# Patient Record
Sex: Male | Born: 1970 | Race: White | Hispanic: No | Smoking: Former smoker
Health system: Southern US, Community
[De-identification: ages and names within clinical notes are randomized; demographics above are authoritative.]

## PROBLEM LIST (undated history)

## (undated) ENCOUNTER — Ambulatory Visit (HOSPITAL_COMMUNITY): Discharge status: Absent without leave | Payer: No Typology Code available for payment source

## (undated) DIAGNOSIS — F419 Anxiety disorder, unspecified: Secondary | ICD-10-CM

## (undated) DIAGNOSIS — F329 Major depressive disorder, single episode, unspecified: Secondary | ICD-10-CM

## (undated) DIAGNOSIS — F909 Attention-deficit hyperactivity disorder, unspecified type: Secondary | ICD-10-CM

## (undated) DIAGNOSIS — F32A Depression, unspecified: Secondary | ICD-10-CM

## (undated) DIAGNOSIS — F952 Tourette's disorder: Secondary | ICD-10-CM

## (undated) HISTORY — DX: Anxiety disorder, unspecified: F41.9

## (undated) HISTORY — DX: Depression, unspecified: F32.A

## (undated) HISTORY — DX: Attention-deficit hyperactivity disorder, unspecified type: F90.9

## (undated) HISTORY — DX: Tourette's disorder: F95.2

## (undated) HISTORY — DX: Major depressive disorder, single episode, unspecified: F32.9

---

## 2017-03-27 ENCOUNTER — Encounter: Payer: Self-pay | Admitting: Pulmonary Disease

## 2017-03-27 ENCOUNTER — Ambulatory Visit (INDEPENDENT_AMBULATORY_CARE_PROVIDER_SITE_OTHER)
Admission: RE | Admit: 2017-03-27 | Discharge: 2017-03-27 | Disposition: A | Discharge status: Home / Self Care | Payer: Non-veteran care | Source: Ambulatory Visit | Attending: Pulmonary Disease | Admitting: Pulmonary Disease

## 2017-03-27 ENCOUNTER — Ambulatory Visit (INDEPENDENT_AMBULATORY_CARE_PROVIDER_SITE_OTHER): Payer: Non-veteran care | Admitting: Pulmonary Disease

## 2017-03-27 VITALS — BP 106/64 | HR 100 | Ht 69.0 in | Wt 162.0 lb

## 2017-03-27 DIAGNOSIS — J440 Chronic obstructive pulmonary disease with acute lower respiratory infection: Secondary | ICD-10-CM

## 2017-03-27 DIAGNOSIS — R0602 Shortness of breath: Secondary | ICD-10-CM

## 2017-03-27 DIAGNOSIS — G4710 Hypersomnia, unspecified: Secondary | ICD-10-CM | POA: Insufficient documentation

## 2017-03-27 DIAGNOSIS — Z720 Tobacco use: Secondary | ICD-10-CM | POA: Diagnosis not present

## 2017-03-27 DIAGNOSIS — J449 Chronic obstructive pulmonary disease, unspecified: Secondary | ICD-10-CM | POA: Insufficient documentation

## 2017-03-27 MED ORDER — BUDESONIDE-FORMOTEROL FUMARATE 160-4.5 MCG/ACT IN AERO: 2.0000 | INHALATION_SPRAY | Freq: Two times a day (BID) | RESPIRATORY_TRACT | 0 refills | Status: DC

## 2017-03-27 NOTE — Addendum Note (Signed)
Addended by: Maurene CapesPOTTS, Cally Nygard M on: 03/27/2017 09:44 AM   Modules accepted: Orders

## 2017-03-27 NOTE — Assessment & Plan Note (Addendum)
Although technically this is not COPD, will treat him as same  Add Symbicort 160 twice daily -I suspect that he will not need this long-term if he is able to quit smoking.  Will reassess in 1 month if he needs this longer. Continue albuterol to use as needed for breakthrough symptoms  Will [facility] and if necessary will clarify with high-resolution CT chest without he does have interstitial lung disease are not

## 2017-03-27 NOTE — Assessment & Plan Note (Signed)
Smoking cessation was emphasized and he was counseled. He will meet with his mental health provider to discuss Chantix.  Mean while I discussed using nicotine inhaler or alternative and during smoking while driving and making another quit attempt.  His wife also makes smokes and this makes it more difficult

## 2017-03-27 NOTE — Progress Notes (Addendum)
Subjective:    Patient ID: Larry Stewart, male    DOB: 15-Oct-1970, 47 y.o.   MRN: [id number]  HPI  Chief Complaint  Patient presents with  . Pulm Consult    [facility], dry [facility] and difficulty swallowing. Started 4-5 years ago, now getting worse.     47 [facility] on exertion with wheezing and cough that has been ongoing for 4 years but worse within the last 2 months. [facility] with the pulmonologist was not until April hence he was referred to us. He reports dyspnea on exertion for at least 4 years.  He reports back-to-back episodes of bronchitis and URI in November and December 2018 requiring steroids and antibiotics and following this her symptoms have been much worse.  He can walk for short distances but anything more makes him struggle for breath.  He can carry his garbage can for about 20 yards and he is out of breath.  He also reports wheezing on exertion, triggered by heat or cold.  He has dry cough very occasionally productive of clear sputum that is not relieved by Robitussin.  He reports smothering spells when he has a blanket over his face. Albuterol seems to help with his symptoms, he was given [facility] couple of years ago and he is using this at least 4-5 times daily  He denies frequent chest colds.  He has tried to quit smoking in the past with the use of aids such as lozenges, nicotine patch, Wellbutrin.  He is awaiting appointment with his mental health provider to discuss Chantix  I reviewed [facility] from his P[facility] and mention is made of a CT thorax showing possible interstitial lung disease and chronic bronchitis  Past medical history-history of hypertension, severe depression and anxiety, ADHD and Tourette's syndrome requiring risperidone.  He also has hypothyroid and chronic back issues.  He has chronic insomnia On  trazodone and GERD He has a history of a AKi following UTI in 2015 He reports a history of asthma onset in his late teens that lasted 2-3 years and resolved when he started working in [employer]  He underwent vasectomy in 2016 and left ulnar nerve repair in 1986  Social history-he was in the [employer] from [employer]. Now works for [employer], lives with his wife and stepson. He started smoking around 89 years of age and smokes about a pack per day, more than 30 pack years  Family history-emphysema in his dad, heart disease in mother, throat cancer in sister.   Spirometry surprisingly shows ratio of 82, FEV1 of 88% and FVC of 75% suggestive of mild restriction    Review of Systems  Positive for shortness of breath with activity, nonproductive cough, acid heartburn indigestion, difficulty swallowing, headaches and nasal congestion, earache, anxiety depression  Constitutional: negative for anorexia, fevers and sweats  Eyes: negative for irritation, redness and visual disturbance  Ears, nose, mouth, throat, and face: negative for earaches, epistaxis, nasal congestion and sore throat  Respiratory: negative for sputum and wheezing  Cardiovascular: negative for chest pain, dyspnea, lower extremity edema, orthopnea, palpitations and syncope  Gastrointestinal: negative for abdominal pain, constipation, diarrhea, melena, nausea and vomiting  Genitourinary:negative for dysuria, frequency and hematuria  Hematologic/lymphatic: negative for bleeding, easy bruising and lymphadenopathy  Musculoskeletal:negative for arthralgias, muscle weakness and stiff joints  Neurological: negative  for coordination problems, gait problems, headaches and weakness  Endocrine: negative for diabetic symptoms including polydipsia, polyuria and weight loss      Objective:   Physical Exam  Gen. Pleasant, obese, in no distress, anxious affect ENT - no lesions, no post nasal drip, class 2 airway Neck: No JVD, no thyromegaly,  no carotid bruits Lungs: no use of accessory muscles, no dullness to percussion, decreased without rales or rhonchi  Cardiovascular: Rhythm regular, heart sounds  normal, no murmurs or gallops, no peripheral edema Abdomen: soft and non-tender, no hepatosplenomegaly, BS normal. Musculoskeletal: No deformities, no cyanosis or clubbing Neuro:  alert, non focal, no tremors       Assessment & Plan:

## 2017-03-27 NOTE — Patient Instructions (Addendum)
Chest x-ray today Trial of Symbicort 160- 2 puffs twice daily-rinse mouth after use  You have to make another attempt to quit smoking

## 2017-03-27 NOTE — Assessment & Plan Note (Signed)
-  Related to medications such as clonidine and risperidone. I have asked him to stop taking daytime dose of clonidine

## 2017-04-04 ENCOUNTER — Telehealth: Payer: Self-pay | Admitting: Pulmonary Disease

## 2017-04-04 NOTE — Telephone Encounter (Signed)
Sorry that was [place] from the [facility]

## 2017-04-04 NOTE — Telephone Encounter (Signed)
Called [place] back. She did not answer. Apparently she does not have a VM either. Will call back.   If she calls back, please get a fax number so that we can fax the OV notes to her. Thanks!

## 2017-04-08 NOTE — Telephone Encounter (Signed)
Attempted to contact Synetta FailAnita. No answer, no option to leave a message. Will try back.

## 2017-04-10 NOTE — Telephone Encounter (Signed)
.  I called Synetta Failnita but no answer and unable to leave message.

## 2017-04-11 NOTE — Telephone Encounter (Signed)
Attempted to contact Larry FailAnita x3. No answer, no option to leave a message. We have attempted to contact her several times with no success or call back. Per triage protocol, message will be closed.

## 2017-04-30 ENCOUNTER — Encounter: Payer: Self-pay | Admitting: Adult Health

## 2017-04-30 ENCOUNTER — Ambulatory Visit (INDEPENDENT_AMBULATORY_CARE_PROVIDER_SITE_OTHER): Payer: No Typology Code available for payment source | Admitting: Adult Health

## 2017-04-30 DIAGNOSIS — Z720 Tobacco use: Secondary | ICD-10-CM | POA: Diagnosis not present

## 2017-04-30 DIAGNOSIS — K219 Gastro-esophageal reflux disease without esophagitis: Secondary | ICD-10-CM | POA: Insufficient documentation

## 2017-04-30 DIAGNOSIS — R0602 Shortness of breath: Secondary | ICD-10-CM | POA: Diagnosis not present

## 2017-04-30 DIAGNOSIS — J449 Chronic obstructive pulmonary disease, unspecified: Secondary | ICD-10-CM

## 2017-04-30 MED ORDER — BUDESONIDE-FORMOTEROL FUMARATE 160-4.5 MCG/ACT IN AERO: 2.0000 | INHALATION_SPRAY | Freq: Two times a day (BID) | RESPIRATORY_TRACT | 0 refills | Status: DC

## 2017-04-30 MED ORDER — BUDESONIDE-FORMOTEROL FUMARATE 160-4.5 MCG/ACT IN AERO: 2.0000 | INHALATION_SPRAY | Freq: Two times a day (BID) | RESPIRATORY_TRACT | 11 refills | Status: DC

## 2017-04-30 NOTE — Assessment & Plan Note (Signed)
Continue on GERD treatment and diet

## 2017-04-30 NOTE — Progress Notes (Signed)
47 y/o M presents today for refill on Simbicort.  Last seen by Dr. Vassie LollAlva on 03/27/17 for chronic SOB and chronic cough.  States since then he has been taking the Simbicort as prescribed and his SOB with exertion has been a lot better.  States he is able to walk longer distance now before becoming SOB.  He continues to cough up white frothy sputum and sometimes clear sputum.  Cough is worse first thing in the morning and gets better as the day progresses.  Pt continues to smoke cigarettes, 1 pack per day.  Waiting on mental health provider to sign off on Chantix.  States he has had 10lbs unintentional weight loss in the last 2 months.  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  Endorses SOB with exertion, fatigue, sleepy all the time, wheezing with exertion, and occasional vomiting after meals.  Denies hemoptysis, chest pain or palpitations.  Pt is on protonix and zantac to control his GERD symptoms.  Pt is on multiple sedating medications.  Pt is requesting a refill on his Simbicort.  PE:  Ged: Pleasant, in no distress, anxious affect ENT: no lesions, no post nasal drip Neck: No JVD Cardiovascular: Rhythm regular, tachycardia (HR=103), no peripheral edema Lungs: no use of accessory muscles, few scattered rhonchi in bilateral lungs bases Abd: Soft and non-tender, normal BS Neuro: alert, no tremors

## 2017-04-30 NOTE — Assessment & Plan Note (Signed)
Smoking cessation  

## 2017-04-30 NOTE — Progress Notes (Signed)
 @Patient  ID: Larry Stewart, male    DOB: [birth year], 47 y.o.   MRN: [id number]  Chief Complaint  Patient presents with  . Follow-up    [facility]  HPI: 47 yo male smoker , veteran, seen for pulmonary consult 03/2017 for dyspnea.   PMH significant for depression, anxiety , ADHD, and Tourettes -on multiple meds   TEST  Spirometry 03/2017  shows ratio of 82, FEV1 of 88% and FVC of 75% suggestive of mild restriction CXR 03/2017 Clear lungs    04/30/2017 Follow up : COPD  Patient returns for a 2-week follow-up.  Patient was seen last visit for a pulmonary consult for ongoing dyspnea and cough.  Patient is a heavy long-term smoker.  [facility]  Patient says he has had ongoing cough and shortness of breath.  Spirometry showed mild restriction with no significant obstruction.  Patient was started on Symbicort twice daily.  Patient says it did make a difference his shortness of breath decreased substantially.  He still has some ongoing cough.  But this is improved slightly.  Patient is a [employer].  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 Patient does complain he is lost 10 pounds over the last 2 month unintentionally.he denies any hemoptysis chest pain orthopnea PND or increased leg swelling.  He is having significant reflux despite using PPI twice daily and Zantac at bedtime.  He is being followed at GI.  Chest x-ray last visit showed clear lungs. Smoking cessation was discussed with patient.  Patient has tried many things without success..  Patient says he is going to see his mental health provider.  And is going to acid discuss possible Chantix.  Patient is on multiple medications for depression anxiety, and Tourette's.  He is on BuSpar Prozac clonidine Risperdal and trazodone.  Says he feels tired most of the time.  Allergies  Allergen Reactions  . Amlodipine   . Bee Venom     Immunization History    Administered Date(s) Administered  . Influenza Whole 02/19/2017    Past Medical History:  Diagnosis Date  . ADHD   . Anxiety   . Depression   . Tourette's     Tobacco History: Social History   Tobacco Use  Smoking Status Current Every Day Smoker  . Packs/day: 1.00  . Types: Cigarettes  Smokeless Tobacco Never Used   Ready to quit: No Counseling given: Yes   Outpatient Encounter Medications as of 04/30/2017  Medication Sig  . ACETAMINOPHEN-BUTALBITAL 50-325 MG TABS Take 1 tablet by mouth 2 (two) times daily as needed.  Marland Kitchen. albuterol (PROVENTIL HFA;VENTOLIN HFA) 108 (90 Base) MCG/ACT inhaler Inhale 2 puffs into the lungs every 4 (four) hours as needed. Inhale 2 puffs by mouth every four hours as needed for shortness of breath  . budesonide-formoterol (SYMBICORT) 160-4.5 MCG/ACT inhaler Inhale 2 puffs into the lungs 2 (two) times daily.  . budesonide-formoterol (SYMBICORT) 160-4.5 MCG/ACT inhaler Inhale 2 puffs into the lungs 2 (two) times daily.  . budesonide-formoterol (SYMBICORT) 160-4.5 MCG/ACT inhaler Inhale 2 puffs into the lungs 2 (two) times daily.  . busPIRone (BUSPAR) 10 MG tablet Take 10 mg by mouth 3 (three) times daily.  . cetirizine (ZYRTEC) 10 MG tablet Take 10 mg by mouth daily. Take one tablet by mouth daily as needed for allergies  . cloNIDine (CATAPRES) 0.2 MG tablet Take 0.2 mg by mouth 2 (two) times daily.  .Marland Kitchen  FLUoxetine (PROZAC) 20 MG tablet Take 20 mg by mouth daily.  . fluticasone (FLONASE) 50 MCG/ACT nasal spray Place 1 spray into both nostrils 2 (two) times daily as needed.  Marland Kitchen levothyroxine (SYNTHROID, LEVOTHROID) 50 MCG tablet Take 50 mcg by mouth daily before breakfast.  . pantoprazole (PROTONIX) 40 MG tablet Take 40 mg by mouth 2 (two) times daily.  . polyvinyl alcohol (LIQUIFILM TEARS) 1.4 % ophthalmic solution Place 1 drop into both eyes 4 (four) times daily. Instill 1 drop in both eyes four times a day  . ranitidine (ZANTAC) 150 MG capsule Take by  mouth.  . risperidone (RISPERDAL) 4 MG tablet Take 4 mg by mouth 2 (two) times daily.  Marland Kitchen senna-docusate (SENOKOT-S) 8.6-50 MG tablet Take 1 tablet by mouth daily.  . tamsulosin (FLOMAX) 0.4 MG CAPS capsule Take 0.4 mg by mouth.  . traZODone (DESYREL) 100 MG tablet Take 100 mg by mouth at bedtime.   No facility-administered encounter medications on file as of 04/30/2017.      Review of Systems  Constitutional:   No  weight loss, night sweats,  Fevers, chills,  +fatigue, or  lassitude.  HEENT:   No headaches,  Difficulty swallowing,  Tooth/dental problems, or  Sore throat,                No sneezing, itching, ear ache, nasal congestion, post nasal drip,   CV:  No chest pain,  Orthopnea, PND, swelling in lower extremities, anasarca, dizziness, palpitations, syncope.   GI  + GERD , nausea, vomiting, diarrhea, change in bowel habits, loss of appetite, bloody stools.   Resp:   No chest wall deformity  Skin: no rash or lesions.  GU: no dysuria, change in color of urine, no urgency or frequency.  No flank pain, no hematuria   MS:  No joint pain or swelling.  No decreased range of motion.  No back pain.    Physical Exam  BP 104/68 (BP Location: Left Arm, Cuff Size: Normal)   Pulse (!) 103   Ht 5\' 9"  (1.753 m)   Wt 159 lb 6.4 oz (72.3 kg)   SpO2 99%   BMI 23.54 kg/m   GEN: A/Ox3; pleasant , NAD, thin male    HEENT:  [place]/AT,  EACs-clear, TMs-wnl, NOSE-clear, THROAT-clear, no lesions, no postnasal drip or exudate noted.   NECK:  Supple w/ fair ROM; no JVD; normal carotid impulses w/o bruits; no thyromegaly or nodules palpated; no lymphadenopathy.    RESP  Faint crackles noted , no accessory muscle use, no dullness to percussion  CARD:  RRR, no m/r/g, no peripheral edema, pulses intact, no cyanosis or clubbing.  GI:   Soft & nt; nml bowel sounds; no organomegaly or masses detected.   Musco: Warm bil, no deformities or joint swelling noted.   Neuro: alert, no focal deficits  noted.    Skin: Warm, no lesions or rashes    Lab Results:  CBC No results found for: WBC, RBC, HGB, HCT, PLT, MCV, MCH, MCHC, RDW, LYMPHSABS, MONOABS, EOSABS, BASOSABS  BMET No results found for: NA, K, CL, CO2, GLUCOSE, BUN, CREATININE, CALCIUM, GFRNONAA, GFRAA  BNP No results found for: BNP  ProBNP No results found for: PROBNP  Imaging: No results found.   Assessment & Plan:   COPD (chronic obstructive pulmonary disease) (HCC) Mild airway restriction noted.  No significant obstruction noted.  Smoking cessation was discussed.  Patient did have symptomatic improvement on Symbicort.  Will restart this.  Have patient  return with a full PFT. 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 We will check a high resolution CT chest to evaluate further.  Plan  Patient Instructions  Set up for HRCT Chest .  Restart Symbicort 2 puffs Twice daily  , rinse after use.  Work on not smokiing.  Mucinex DM Twice daily As needed  Cough/congestion .  GERD diet and treatment .  Follow up with Dr. Vassie Loll  In 6 weeks with PFT and As needed   Please contact office for sooner follow up if symptoms do not improve or worsen or seek emergency care       Tobacco abuse Smoking cessation  GERD (gastroesophageal reflux disease) Continue on GERD treatment and diet     Rubye Oaks, NP 04/30/2017

## 2017-04-30 NOTE — Patient Instructions (Addendum)
Set up for HRCT Chest .  Restart Symbicort 2 puffs Twice daily  , rinse after use.  Work on not smokiing.  Mucinex DM Twice daily As needed  Cough/congestion .  GERD diet and treatment .  Follow up with Dr. Vassie LollAlva  In 6 weeks with PFT and As needed   Please contact office for sooner follow up if symptoms do not improve or worsen or seek emergency care

## 2017-04-30 NOTE — Assessment & Plan Note (Signed)
Mild airway restriction noted.  No significant obstruction noted.  Smoking cessation was discussed.  Patient did have symptomatic improvement on Symbicort.  Will restart this.  Have patient return with a full PFT. 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 We will check a high resolution CT chest to evaluate further.  Plan  Patient Instructions  Set up for HRCT Chest .  Restart Symbicort 2 puffs Twice daily  , rinse after use.  Work on not smokiing.  Mucinex DM Twice daily As needed  Cough/congestion .  GERD diet and treatment .  Follow up with Dr. Vassie LollAlva  In 6 weeks with PFT and As needed   Please contact office for sooner follow up if symptoms do not improve or worsen or seek emergency care

## 2017-05-08 NOTE — Progress Notes (Signed)
Reviewed & agree with plan  

## 2017-05-15 ENCOUNTER — Ambulatory Visit (INDEPENDENT_AMBULATORY_CARE_PROVIDER_SITE_OTHER)
Admission: RE | Admit: 2017-05-15 | Discharge: 2017-05-15 | Disposition: A | Discharge status: Home / Self Care | Payer: Non-veteran care | Source: Ambulatory Visit | Attending: Adult Health | Admitting: Adult Health

## 2017-05-15 DIAGNOSIS — R0602 Shortness of breath: Secondary | ICD-10-CM | POA: Diagnosis not present

## 2017-05-20 ENCOUNTER — Telehealth: Payer: Self-pay | Admitting: Pulmonary Disease

## 2017-05-20 NOTE — Telephone Encounter (Signed)
Called and spoke with patient he is aware of results and verbalized understanding. 

## 2017-05-24 ENCOUNTER — Telehealth: Payer: Self-pay | Admitting: Pulmonary Disease

## 2017-05-24 DIAGNOSIS — J439 Emphysema, unspecified: Secondary | ICD-10-CM

## 2017-05-24 NOTE — Telephone Encounter (Signed)
I have obtained and reviewed [facility] Issues bilateral upper lobe subpleural groundglass opacities and tree-in-bud nodules with paraseptal emphysema-this may relate to smoking-related interstitial lung disease. There is a small 4 mm nodule in the left upper lobe.  Suggest follow-up high-resolution CT chest without contrast in 02/2018

## 2017-05-27 NOTE — Telephone Encounter (Signed)
Called pt letting him know the results of ct scan that was from 02/25/17. Stated to pt we would do a follow up ct scan 02/2017.  Pt expressed understanding. Order placed to have HRCT 12/19  Nothing further needed at this current time.

## 2017-05-27 NOTE — Telephone Encounter (Signed)
Pt is returning call. Cb is [phone]

## 2017-05-27 NOTE — Telephone Encounter (Signed)
Left message for patient to call back  

## 2017-06-07 ENCOUNTER — Ambulatory Visit (INDEPENDENT_AMBULATORY_CARE_PROVIDER_SITE_OTHER): Payer: No Typology Code available for payment source | Admitting: Internal Medicine

## 2017-06-07 ENCOUNTER — Ambulatory Visit: Payer: No Typology Code available for payment source | Admitting: Pulmonary Disease

## 2017-06-07 DIAGNOSIS — J449 Chronic obstructive pulmonary disease, unspecified: Secondary | ICD-10-CM | POA: Diagnosis not present

## 2017-06-07 DIAGNOSIS — R0602 Shortness of breath: Secondary | ICD-10-CM

## 2017-06-07 LAB — PULMONARY FUNCTION TEST
DL/VA % PRED: 72 %
DL/VA: 3.34 ml/min/mmHg/L
DLCO UNC: 19.53 ml/min/mmHg
DLCO unc % pred: 63 %
FEF 25-75 POST: 2.96 L/s
FEF 25-75 Pre: 3.35 L/sec
FEF2575-%Change-Post: -11 %
FEF2575-%PRED-POST: 82 %
FEF2575-%Pred-Pre: 93 %
FEV1-%Change-Post: -3 %
FEV1-%Pred-Post: 78 %
FEV1-%Pred-Pre: 81 %
FEV1-Post: 3.09 L
FEV1-Pre: 3.21 L
FEV1FVC-%Change-Post: -4 %
FEV1FVC-%PRED-PRE: 104 %
FEV6-%Change-Post: 0 %
FEV6-%Pred-Post: 80 %
FEV6-%Pred-Pre: 79 %
FEV6-Post: 3.9 L
FEV6-Pre: 3.86 L
FEV6FVC-%CHANGE-POST: 0 %
FEV6FVC-%Pred-Post: 103 %
FEV6FVC-%Pred-Pre: 102 %
FVC-%Change-Post: 0 %
FVC-%PRED-POST: 78 %
FVC-%PRED-PRE: 77 %
FVC-POST: 3.9 L
FVC-Pre: 3.87 L
POST FEV1/FVC RATIO: 79 %
Post FEV6/FVC ratio: 100 %
Pre FEV1/FVC ratio: 83 %
Pre FEV6/FVC Ratio: 100 %
RV % pred: 87 %
RV: 1.65 L
TLC % pred: 86 %
TLC: 5.83 L

## 2017-06-07 NOTE — Progress Notes (Signed)
PFT done today. 

## 2017-06-11 ENCOUNTER — Ambulatory Visit (INDEPENDENT_AMBULATORY_CARE_PROVIDER_SITE_OTHER): Payer: Non-veteran care | Admitting: Pulmonary Disease

## 2017-06-11 ENCOUNTER — Encounter: Payer: Self-pay | Admitting: Pulmonary Disease

## 2017-06-11 DIAGNOSIS — J432 Centrilobular emphysema: Secondary | ICD-10-CM

## 2017-06-11 DIAGNOSIS — J849 Interstitial pulmonary disease, unspecified: Secondary | ICD-10-CM | POA: Diagnosis not present

## 2017-06-11 DIAGNOSIS — Z720 Tobacco use: Secondary | ICD-10-CM

## 2017-06-11 NOTE — Progress Notes (Signed)
   Subjective:    Patient ID: Larry Stewart, male    DOB: July 17, 1970, 47 y.o.   MRN: [id number]  HPI  47 yo male smoker , veteran, for follow-up of dyspnea I have reviewed his prior CT scan from 12/28 which shows faint groundglass markings in both upper lobes and paraseptal emphysema. Repeat CT scan 04/2017 was reviewed in detail which showed no progression of these findings.  PFTs were also reviewed today  .  He continues on Symbicort and feels like this is helping   Past medical history-history of hypertension, severe depression and anxiety, ADHD and Tourette's syndrome requiring risperidone.  He also has hypothyroid and chronic back issues.  He has chronic insomnia  Significant tests/ events reviewed  Spirometry 03/2017  shows ratio of 82, FEV1 of 88% and FVC of 75% suggestive of mild restriction  PFTs 05/2017 ratio normal, FEV1 81% DLCO 63%, TLC normal denies cough, he had an ED visit for flulike symptoms and was given albuterol nebs and nasal inhaler  CT chest 02/2017  bilateral upper lobe subpleural groundglass opacities and tree-in-bud nodules with paraseptal emphysema-this may relate to smoking-related interstitial lung disease.4 mm nodule in the left upper lobe.  Review of Systems Patient denies significant dyspnea,cough, hemoptysis,  chest pain, palpitations, pedal edema, orthopnea, paroxysmal nocturnal dyspnea, lightheadedness, nausea, vomiting, abdominal or  leg pains      Objective:   Physical Exam  Gen. Pleasant, well-nourished, in no distress ENT - no thrush, no post nasal drip Neck: No JVD, no thyromegaly, no carotid bruits Lungs: no use of accessory muscles, no dullness to percussion, clear without rales or rhonchi  Cardiovascular: Rhythm regular, heart sounds  normal, no murmurs or gallops, no peripheral edema Musculoskeletal: No deformities, no cyanosis or clubbing        Assessment & Plan:

## 2017-06-11 NOTE — Assessment & Plan Note (Signed)
Smoking cessation is the most important intervention. He has met with a psychiatrist and discuss Chantix, preferred to try nicotine supplements before

## 2017-06-11 NOTE — Assessment & Plan Note (Signed)
Continue on Symbicort for now and if you are able to quit smoking, you may be able to come off this medication

## 2017-06-11 NOTE — Patient Instructions (Signed)
You have emphysema and the beginnings of smoking-related damage to your lungs. Smoking cessation is the most important intervention. Continue on Symbicort for now and if you are able to quit smoking, you may be able to come off this medication

## 2017-06-11 NOTE — Assessment & Plan Note (Addendum)
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

## 2017-07-16 ENCOUNTER — Other Ambulatory Visit: Payer: Self-pay

## 2017-07-16 ENCOUNTER — Ambulatory Visit
Admission: RE | Admit: 2017-07-16 | Discharge: 2017-07-16 | Disposition: A | Discharge status: Home / Self Care | Payer: Self-pay | Source: Ambulatory Visit | Attending: Pulmonary Disease | Admitting: Pulmonary Disease

## 2017-07-16 DIAGNOSIS — J432 Centrilobular emphysema: Secondary | ICD-10-CM

## 2017-12-18 ENCOUNTER — Ambulatory Visit: Payer: Non-veteran care | Admitting: Pulmonary Disease

## 2018-02-14 ENCOUNTER — Ambulatory Visit (INDEPENDENT_AMBULATORY_CARE_PROVIDER_SITE_OTHER)
Admission: RE | Admit: 2018-02-14 | Discharge: 2018-02-14 | Disposition: A | Discharge status: Home / Self Care | Payer: No Typology Code available for payment source | Source: Ambulatory Visit | Attending: Pulmonary Disease | Admitting: Pulmonary Disease

## 2018-02-14 DIAGNOSIS — J439 Emphysema, unspecified: Secondary | ICD-10-CM | POA: Diagnosis not present

## 2018-02-17 ENCOUNTER — Ambulatory Visit: Payer: Non-veteran care | Admitting: Pulmonary Disease

## 2018-02-18 ENCOUNTER — Telehealth: Payer: Self-pay | Admitting: Pulmonary Disease

## 2018-02-18 ENCOUNTER — Ambulatory Visit: Payer: Non-veteran care | Admitting: Pulmonary Disease

## 2018-02-18 NOTE — Telephone Encounter (Signed)
OV [facility] 06/11/17, [facility], to [phone]. Left detailed message for Janelle Flooraomi, informing her of requested fax being sent.  Nothing further at this time.

## 2018-03-04 ENCOUNTER — Encounter: Payer: Self-pay | Admitting: Primary Care

## 2018-03-04 ENCOUNTER — Ambulatory Visit: Payer: Non-veteran care | Admitting: Nurse Practitioner

## 2018-03-04 ENCOUNTER — Ambulatory Visit (INDEPENDENT_AMBULATORY_CARE_PROVIDER_SITE_OTHER): Payer: No Typology Code available for payment source | Admitting: Primary Care

## 2018-03-04 VITALS — BP 136/86 | HR 92 | Temp 97.9°F | Ht 69.0 in | Wt 157.2 lb

## 2018-03-04 DIAGNOSIS — J849 Interstitial pulmonary disease, unspecified: Secondary | ICD-10-CM | POA: Diagnosis not present

## 2018-03-04 MED ORDER — ALBUTEROL SULFATE HFA 108 (90 BASE) MCG/ACT IN AERS: 2.0000 | INHALATION_SPRAY | RESPIRATORY_TRACT | 3 refills | Status: DC | PRN

## 2018-03-04 MED ORDER — PREDNISONE 10 MG PO TABS: ORAL_TABLET | ORAL | 0 refills | Status: DC

## 2018-03-04 NOTE — Assessment & Plan Note (Signed)
-  Advised Symbicort 160- two puffs TWICE daily

## 2018-03-04 NOTE — Patient Instructions (Addendum)
STOP vaping, smoking and marijuana!  Labs- ANA and CCP   Prednisone 20mg  x 1 week; 10mg  x 1 week; 5mg  x 1 week  Make sure to use Symbicort twice daily as prescribed  Follow up with Dr. Vassie LollAlva next available in 6 weeks with PFTs prior

## 2018-03-04 NOTE — Progress Notes (Signed)
@Patient  ID: Larry Stewart, male    DOB: [birth year], 47 y.o.   MRN: [id number]  Chief Complaint  Patient presents with  . Follow-up    SOB, chest tightness, wheezing, cough fit with vommiting, knot in throat (thing get caught on it)    Referring provider: [facility]  HPI: 47 year old male, current smoker. PMH significant for COPD, ILD and GERD. Patient of Dr. Vassie Loll, last seen on 06/11/17.   03/04/2018  Patient presents today for a routine follow-up to review recent imaging.  HRCT in November showed evidence of ILD with an unusual pattern. Patient states that he is still smoking.  Admits to smoking marijuana since last year and also vaping THC oil. He last vaped approximately 2 weeks ago.  He would like to stop smoking, has an appointment with a hip unit test this week.  He has been unable to quit in the past with patches or gum. Reports coughing fits to the point of getting sick. Associated wheezing and sob. He has not been taking Symbicort twice daily. Hx hiatal hernia. States that he saw GI at [facility] clinic recently.     Imaging: Chest CT 02/14/18- There is evidence of interstitial lung disease, with an unusual pattern of findings, as detailed above. Based on these findings, this is categorized as alternative diagnosis per current ATS guidelines. Favored differential considerations is that of nonspecific interstitial pneumonia (NSIP) superimposed upon a background of emphysematous changes.   Diffuse bronchial wall thickening with mild centrilobular and mild-to-moderate paraseptal emphysema; imaging findings compatible with reported clinical history of COPD.  Allergies  Allergen Reactions  . Amlodipine   . Bee Venom     Immunization History  Administered Date(s) Administered  . Influenza Whole 02/19/2017    Past Medical History:  Diagnosis Date  . ADHD   . Anxiety   . Depression   . Tourette's     Tobacco History: Social History   Tobacco Use    Smoking Status Current Every Day Smoker  . Packs/day: 1.00  . Types: Cigarettes  Smokeless Tobacco Never Used   Ready to quit: Not Answered Counseling given: Not Answered   Outpatient Medications Prior to Visit  Medication Sig Dispense Refill  . ACETAMINOPHEN-BUTALBITAL 50-325 MG TABS Take 1 tablet by mouth 2 (two) times daily as needed.    . budesonide-formoterol (SYMBICORT) 160-4.5 MCG/ACT inhaler Inhale 2 puffs into the lungs 2 (two) times daily. 1 Inhaler 0  . budesonide-formoterol (SYMBICORT) 160-4.5 MCG/ACT inhaler Inhale 2 puffs into the lungs 2 (two) times daily. 1 Inhaler 11  . busPIRone (BUSPAR) 10 MG tablet Take 10 mg by mouth 3 (three) times daily.    . cetirizine (ZYRTEC) 10 MG tablet Take 10 mg by mouth daily. Take one tablet by mouth daily as needed for allergies    . cloNIDine (CATAPRES) 0.2 MG tablet Take 0.2 mg by mouth 2 (two) times daily.    Marland Kitchen FLUoxetine (PROZAC) 20 MG tablet Take 20 mg by mouth daily.    . fluticasone (FLONASE) 50 MCG/ACT nasal spray Place 1 spray into both nostrils 2 (two) times daily as needed.    Marland Kitchen levothyroxine (SYNTHROID, LEVOTHROID) 50 MCG tablet Take 50 mcg by mouth daily before breakfast.    . pantoprazole (PROTONIX) 40 MG tablet Take 40 mg by mouth 2 (two) times daily.    . polyvinyl alcohol (LIQUIFILM TEARS) 1.4 % ophthalmic solution Place 1 drop into both eyes 4 (four) times daily. Instill 1 drop in  both eyes four times a day    . ranitidine (ZANTAC) 150 MG capsule Take by mouth.    . risperidone (RISPERDAL) 4 MG tablet Take 4 mg by mouth 2 (two) times daily.    Marland Kitchen senna-docusate (SENOKOT-S) 8.6-50 MG tablet Take 1 tablet by mouth daily.    . tamsulosin (FLOMAX) 0.4 MG CAPS capsule Take 0.4 mg by mouth.    . traZODone (DESYREL) 100 MG tablet Take 100 mg by mouth at bedtime.    Marland Kitchen albuterol (PROVENTIL HFA;VENTOLIN HFA) 108 (90 Base) MCG/ACT inhaler Inhale 2 puffs into the lungs every 4 (four) hours as needed. Inhale 2 puffs by mouth every  four hours as needed for shortness of breath     No facility-administered medications prior to visit.    Review of Systems  Review of Systems  Constitutional: Negative.   Respiratory: Positive for cough, shortness of breath and wheezing.   Cardiovascular: Negative.   Gastrointestinal: Positive for vomiting.   Physical Exam  BP 136/86 (BP Location: Left Arm, Cuff Size: Normal)   Pulse 92   Temp 97.9 F (36.6 C)   Ht 5\' 9"  (1.753 m)   Wt 157 lb 3.2 oz (71.3 kg)   SpO2 96%   BMI 23.21 kg/m  Physical Exam  Constitutional: He is oriented to person, place, and time.  Slender adult male   HENT:  Head: Normocephalic and atraumatic.  Eyes: Pupils are equal, round, and reactive to light. EOM are normal.  Neck: Normal range of motion. Neck supple. No tracheal deviation present. No thyromegaly present.  Cardiovascular: Normal rate and regular rhythm.  Pulmonary/Chest: Effort normal. No respiratory distress. He has no wheezes.  Mostly clear t/o; no significant wheezing, rales or rhonchi  Musculoskeletal: Normal range of motion.  Lymphadenopathy:    He has no cervical adenopathy.  Neurological: He is alert and oriented to person, place, and time.  Skin: Skin is warm and dry.  Tattoos to arms and chest  Psychiatric: He has a normal mood and affect. His behavior is normal. Judgment and thought content normal.     Lab Results:  CBC No results found for: WBC, RBC, HGB, HCT, PLT, MCV, MCH, MCHC, RDW, LYMPHSABS, MONOABS, EOSABS, BASOSABS  BMET No results found for: NA, K, CL, CO2, GLUCOSE, BUN, CREATININE, CALCIUM, GFRNONAA, GFRAA  BNP No results found for: BNP  ProBNP No results found for: PROBNP  Imaging: Ct Chest High Resolution  Result Date: 02/14/2018 CLINICAL DATA:  47 year old male with history of COPD. Recent lung infection 1 month ago. Treated with antibiotics. Chronic productive cough. EXAM: CT CHEST WITHOUT CONTRAST TECHNIQUE: Multidetector CT imaging of the chest  was performed following the standard protocol without intravenous contrast. High resolution imaging of the lungs, as well as inspiratory and expiratory imaging, was performed. COMPARISON:  High-resolution chest CT 05/15/2017. FINDINGS: Cardiovascular: Heart size is normal. There is no significant pericardial fluid, thickening or pericardial calcification. Aortic atherosclerosis. No definite coronary artery calcifications. Mediastinum/Nodes: No pathologically enlarged mediastinal or hilar lymph nodes. Please note that accurate exclusion of hilar adenopathy is limited on noncontrast CT scans. Esophagus is unremarkable in appearance. No axillary lymphadenopathy. Lungs/Pleura: High-resolution images demonstrate relatively widespread but very mild ground-glass attenuation and peripheral predominant septal thickening. These findings have no definitive craniocaudal gradient, and in fact, the areas of septal thickening are most pronounced in the anterior aspects of the upper lobes of the lungs bilaterally, where there may be some evidence of early honeycombing (not yet definitive). Relative sparing of  the extreme lung bases. No traction bronchiectasis. Inspiratory and expiratory image is unremarkable. Mild diffuse bronchial wall thickening with mild centrilobular and mild-to-moderate paraseptal emphysema. No acute consolidative airspace disease. No pleural effusions. No suspicious appearing pulmonary nodules or masses are noted. Upper Abdomen: Unremarkable. Musculoskeletal: There are no aggressive appearing lytic or blastic lesions noted in the visualized portions of the skeleton. IMPRESSION: 1. There is evidence of interstitial lung disease, with an unusual pattern of findings, as detailed above. Based on these findings, this is categorized as alternative diagnosis per current ATS guidelines. Favored differential considerations is that of nonspecific interstitial pneumonia (NSIP) superimposed upon a background of  emphysematous changes, or early chronic hypersensitivity pneumonitis, however, there is no associated air trapping (which would typically be seen with either of these entities). Given the patient's history of smoking, the possibility of a smoking related disease such as desquamative interstitial pneumonia (DIP) could also be considered. 2. Diffuse bronchial wall thickening with mild centrilobular and mild-to-moderate paraseptal emphysema; imaging findings compatible with reported clinical history of COPD. 3. Aortic atherosclerosis. Aortic Atherosclerosis (ICD10-I70.0). Electronically Signed   By: Trudie Reedaniel  Entrikin M.D.   On: 02/14/2018 16:13     Assessment & Plan:   Discussion: 47 year old male, current smoker.  History of COPD and ILD.  Beginnings of smoking related lung injury back in March 2019.  He was advised to quit smoking.  HRCT in November showed evidence of ILD with unusual pattern.  Reports to me that he has been vaping THC oil, smoking tobacco and marijuana.  He complains of coughing fits with vomiting and associated shortness of breath and wheezing.  Reviewed recent HRCT images with Dr. Vassie LollAlva today in office, likely RB-ILD versus NSIP.  We will treat with a course of prednisone over 3 weeks.  Most important thing is for patient to stop vaping altogether and quit smoking.  Follow-up in 6 weeks with PFTs prior.  ILD (interstitial lung disease) (HCC) - HRCT showed evidence of ILD with unusual pattern. Discussed image results with Dr. Vassie LollAlva. Dx RB-ILD vs NSIP (possible vaping related) - Strongly advised patient stop smoking THC oil, tobacco and marijuana  - Needs ANA and CCP labs - Prednisone taper over 3 weeks (20mg  x 1 week; 10mg  x 1 week; 5mg  x 1 week) - Follow-up with Dr. Vassie LollAlva in 6 weeks with PFTs prior    COPD (chronic obstructive pulmonary disease) (HCC) -Advised Symbicort 160- two puffs TWICE daily  Glenford BayleyElizabeth W Ramanda Paules, NP 03/04/2018

## 2018-03-04 NOTE — Assessment & Plan Note (Addendum)
-   HRCT showed evidence of ILD with unusual pattern. Discussed image results with Dr. Vassie LollAlva. Dx RB-ILD vs NSIP (possible vaping related) - Strongly advised patient stop smoking THC oil, tobacco and marijuana  - Needs ANA and CCP labs - Prednisone taper over 3 weeks (20mg  x 1 week; 10mg  x 1 week; 5mg  x 1 week) - Follow-up with Dr. Vassie LollAlva in 6 weeks with PFTs prior

## 2018-03-05 ENCOUNTER — Telehealth: Payer: Self-pay | Admitting: Primary Care

## 2018-03-05 LAB — CYCLIC CITRUL PEPTIDE ANTIBODY, IGG: Cyclic Citrullin Peptide Ab: <16 UNITS

## 2018-03-05 LAB — ANA: Anti Nuclear Antibody(ANA): NEGATIVE

## 2018-03-05 MED ORDER — ALBUTEROL SULFATE HFA 108 (90 BASE) MCG/ACT IN AERS: 2.0000 | INHALATION_SPRAY | RESPIRATORY_TRACT | 3 refills | Status: DC | PRN

## 2018-03-05 MED ORDER — PREDNISONE 10 MG PO TABS: ORAL_TABLET | ORAL | 0 refills | Status: DC

## 2018-03-05 NOTE — Telephone Encounter (Signed)
Pt is calling back [phone]

## 2018-03-05 NOTE — Telephone Encounter (Signed)
LMTCB for patient-he took printed Rx's with him at visit; does he still have them to take to [facility]?.Marland Kitchen

## 2018-03-05 NOTE — Telephone Encounter (Signed)
Called and spoke with patient he stated that these medications needed to be sent over to the [facility]. I have faxed these over. Nothing further needed.

## 2018-03-10 ENCOUNTER — Telehealth: Payer: Self-pay | Admitting: Pulmonary Disease

## 2018-03-10 NOTE — Telephone Encounter (Signed)
Called and spoke with patient, he was seen last week and given an albuterol inhaler and prednisone. He has not felt any better since taking them. He was wanting to know if he could start on a breathing treatment. Arlys JohnBrian please advise, thank you.

## 2018-03-10 NOTE — Telephone Encounter (Signed)
Called patient in regards to [name] questions. Patient stated that he is still smoking cigarettes but he is no longer vaping or using marijuana. Beth please advise, thank you.

## 2018-03-10 NOTE — Telephone Encounter (Signed)
Ambulatory referral for new nebulizer start. Albuterol neb every 4-6 hours as needed for sob/wheeze. DX pulmonary fibrosis.

## 2018-03-10 NOTE — Telephone Encounter (Signed)
I am sorry the patient is not feeling better.    If patient symptoms have not improved under current recommendations patient does not need office visit for further evaluation.  Is the patient still vaping?  Is the patient still smoking?  If the patient still using marijuana or smoking/vaping THC oils?     Could consider higher prednisone taper but patient will need office visit for further evaluation as patient is high risk for EVALI due to vaping history and NSIP versus DIP on high-res CT.  Will route to Buelah ManisBeth Walsh NP as she saw the patient last in office. She may have more input regarding the patient and work up. Please contact the patient regarding smoking and vape use.   Elisha HeadlandBrian Demontray Franta, FNP

## 2018-03-11 ENCOUNTER — Other Ambulatory Visit: Payer: Self-pay

## 2018-03-11 DIAGNOSIS — J84112 Idiopathic pulmonary fibrosis: Secondary | ICD-10-CM

## 2018-03-11 MED ORDER — ALBUTEROL SULFATE (2.5 MG/3ML) 0.083% IN NEBU: 2.5000 mg | INHALATION_SOLUTION | RESPIRATORY_TRACT | 12 refills | Status: DC | PRN

## 2018-03-11 NOTE — Telephone Encounter (Signed)
Patient calling stating he just called the [facility] and was told they do not have order we faxed for nebulizer.  He states [facility] and would like to be able to pick this up while there. Patient requests we call him back today with update, CB is [phone].

## 2018-03-11 NOTE — Telephone Encounter (Signed)
Referral sent and will fax med order to [facility] [phone].

## 2018-03-11 NOTE — Telephone Encounter (Signed)
Called and spoke with patient and let him know that these orders where just sent off to the  [facility] they should be there by tomorrow morning nothing further needed at this time.

## 2018-03-13 ENCOUNTER — Telehealth: Payer: Self-pay | Admitting: Pulmonary Disease

## 2018-03-13 NOTE — Telephone Encounter (Signed)
LVM for [name] [place] at phone [phone] ext [phone] In regards to pt's nebulizer order- in her message she feels that the order is not sent to correct dept.  [facility].

## 2018-03-14 ENCOUNTER — Telehealth: Payer: Self-pay | Admitting: Pulmonary Disease

## 2018-03-14 NOTE — Telephone Encounter (Signed)
Please get the correct fax number and we will fax it over we faxed it to [phone] the first time

## 2018-03-14 NOTE — Telephone Encounter (Signed)
Call made to Larry BattenKim she said fax order to [phone]. [facility], if further follow up is needed for fax number they will call [name].

## 2018-03-14 NOTE — Telephone Encounter (Signed)
Order has been faxed to the number listed

## 2018-03-14 NOTE — Telephone Encounter (Signed)
Information from referrals tab of pt's chart:   Note   Please provide a new nebulizer machine and supplies.     Type Date User Summary Attachment  General 03/14/2018 11:37 AM Darius BumpBowne, Theresa C Order faxed again to the fax number provided in triage message [phone] -  Note    Order faxed again to the fax number provided in triage message [phone]        Type Date User Summary Attachment  General 03/11/2018 2:06 PM Darius BumpBowne, Theresa C -     Called and spoke with Selena BattenKim [facility] to see if she did receive the order that was faxed to them. Per Selena BattenKim, they still had not received the order. I stated to her that we have faxed it three times to the provided fax number that she gave us.  [name] stated to me that her fax machine is working as she has been getting other faxes from other places and is unsure why the fax for the Rx has not gone through. I stated to her that we would refax the Rx again to see if she is able to get it. [name] expressed understanding.  Order was refaxed to [place] to the provided fax number in [name]'s attn. Confirmation was received that the order went through. Nothing further needed.

## 2018-04-14 ENCOUNTER — Other Ambulatory Visit: Payer: Self-pay | Admitting: *Deleted

## 2018-04-14 DIAGNOSIS — J449 Chronic obstructive pulmonary disease, unspecified: Secondary | ICD-10-CM

## 2018-04-15 ENCOUNTER — Encounter: Payer: Self-pay | Admitting: Pulmonary Disease

## 2018-04-15 ENCOUNTER — Ambulatory Visit (INDEPENDENT_AMBULATORY_CARE_PROVIDER_SITE_OTHER): Payer: No Typology Code available for payment source | Admitting: Pulmonary Disease

## 2018-04-15 DIAGNOSIS — J449 Chronic obstructive pulmonary disease, unspecified: Secondary | ICD-10-CM

## 2018-04-15 DIAGNOSIS — J432 Centrilobular emphysema: Secondary | ICD-10-CM

## 2018-04-15 DIAGNOSIS — J849 Interstitial pulmonary disease, unspecified: Secondary | ICD-10-CM

## 2018-04-15 LAB — PULMONARY FUNCTION TEST
DL/VA % PRED: 72 %
DL/VA: 3.35 ml/min/mmHg/L
DLCO unc % pred: 63 %
DLCO unc: 19.58 ml/min/mmHg
FEF 25-75 POST: 1.89 L/s
FEF 25-75 PRE: 3.69 L/s
FEF2575-%Change-Post: -48 %
FEF2575-%PRED-PRE: 102 %
FEF2575-%Pred-Post: 52 %
FEV1-%CHANGE-POST: -18 %
FEV1-%Pred-Post: 71 %
FEV1-%Pred-Pre: 87 %
FEV1-Post: 2.79 L
FEV1-Pre: 3.45 L
FEV1FVC-%Change-Post: -15 %
FEV1FVC-%PRED-PRE: 103 %
FEV6-%Change-Post: -2 %
FEV6-%PRED-POST: 83 %
FEV6-%Pred-Pre: 86 %
FEV6-POST: 4.06 L
FEV6-PRE: 4.19 L
FEV6FVC-%CHANGE-POST: 0 %
FEV6FVC-%PRED-POST: 102 %
FEV6FVC-%Pred-Pre: 102 %
FVC-%Change-Post: -3 %
FVC-%Pred-Post: 81 %
FVC-%Pred-Pre: 84 %
FVC-PRE: 4.23 L
FVC-Post: 4.08 L
PRE FEV1/FVC RATIO: 81 %
Post FEV1/FVC ratio: 69 %
Post FEV6/FVC ratio: 100 %
Pre FEV6/FVC Ratio: 99 %
RV % pred: 120 %
RV: 2.28 L
TLC % PRED: 94 %
TLC: 6.41 L

## 2018-04-15 NOTE — Assessment & Plan Note (Addendum)
GGO over  upper lung fields/interstitial lung disease which may be related to smoking- RB-ILD or other inflammation such as sarcoidosis EVALI was a possibility but he has quit vaping now  Focus on quitting smoking. He also has mild mediastinal adenopathy which could be related to above, will defer biopsy for now and focus on smoking cessation, especially given improvement in groundglass opacities

## 2018-04-15 NOTE — Progress Notes (Signed)
   Subjective:    Patient ID: Larry Stewart, male    DOB: 1970-07-29, 48 y.o.   MRN: [id number]  [facility]  48 yo smoker , veteran, for follow-up of ILD/emphysema -First noted 02/2017 as faint groundglass markings in both upper lobes and paraseptal emphysema.  Chief Complaint  Patient presents with  . Follow-up    F/U after PFT and CT scan.    We reviewed PFTs which shows improved lung function.  High-resolution CT scan from 01/2018 did not show any disease progression, predominantly upper lobe disease, CCP, ANA negative  He also had scans done at the [place] in 12/2017 and again 03/2018 which I reviewed the last report, also shows mild mediastinal lymphadenopathy and improvement in bilateral groundglass infiltrates  He used to vape THC oil but he states that he has quit now, was hospitalized 02/2018 to [facility] for pneumonia, treated with antibiotics and steroids and seems much improved back to his baseline. Continues to smoke about half pack per day, he has discussed with a psychiatrist, remains on Wellbutrin not a candidate for Chantix.  He is trying hypnosis    Past medical history-history of hypertension, severe depression and anxiety, ADHD and Tourette's syndrome requiring risperidone. He also has hypothyroid and chronic back issues. He has chronic insomnia  Significant tests/ events reviewed  Spirometry1/2019shows ratio of 82, FEV1 of 88% and FVC of 75% suggestive of mild restriction  PFTs 05/2017 ratio normal, FEV1 81% DLCO 63%, TLC normal denies cough, he had an ED visit for flulike symptoms and was given albuterol nebs and nasal inhaler  PFT 03/2018 >> no obstruction, improved FEV1 and FVC, DLCO maintained at 63%  CT chest 02/2017  bilateral upper lobe subpleural groundglass opacities and tree-in-bud nodules with paraseptal emphysema-this may relate to smoking-related interstitial lung disease.4 mm nodule in the left upper lobe  HRCT  01/2018 favor NSIP  Vs DIP + emphysema , diffuse GGO  Past Medical History:  Diagnosis Date  . ADHD   . Anxiety   . Depression   . Tourette's       Review of Systems neg for any significant sore throat, dysphagia, itching, sneezing, nasal congestion or excess/ purulent secretions, fever, chills, sweats, unintended wt loss, pleuritic or exertional cp, hempoptysis, orthopnea pnd or change in chronic leg swelling. Also denies presyncope, palpitations, heartburn, abdominal pain, nausea, vomiting, diarrhea or change in bowel or urinary habits, dysuria,hematuria, rash, arthralgias, visual complaints, headache, numbness weakness or ataxia.     Objective:   Physical Exam   Gen. Pleasant, well-nourished, in no distress, normal affect ENT - no pallor,icterus, no post nasal drip Neck: No JVD, no thyromegaly, no carotid bruits Lungs: no use of accessory muscles, no dullness to percussion, clear without rales or rhonchi  Cardiovascular: Rhythm regular, heart sounds  normal, no murmurs or gallops, no peripheral edema Abdomen: soft and non-tender, no hepatosplenomegaly, BS normal. Musculoskeletal: No deformities, no cyanosis or clubbing Neuro:  alert, non focal        Assessment & Plan:

## 2018-04-15 NOTE — Progress Notes (Signed)
PFT done today. 

## 2018-04-15 NOTE — Patient Instructions (Signed)
Lung function is preserved. You have mild emphysema, stay on Symbicort for now.   You have haziness over your upper lung fields/interstitial lung disease which may be related to smoking or other inflammation such as sarcoidosis  Focus on quitting smoking.

## 2018-04-15 NOTE — Assessment & Plan Note (Signed)
Lung function is preserved. You have mild emphysema, stay on Symbicort for now.

## 2018-06-25 ENCOUNTER — Other Ambulatory Visit (HOSPITAL_BASED_OUTPATIENT_CLINIC_OR_DEPARTMENT_OTHER): Payer: Self-pay

## 2018-06-25 DIAGNOSIS — G47429 Narcolepsy in conditions classified elsewhere without cataplexy: Secondary | ICD-10-CM

## 2018-06-25 DIAGNOSIS — G4711 Idiopathic hypersomnia with long sleep time: Secondary | ICD-10-CM

## 2018-07-08 ENCOUNTER — Telehealth: Payer: Self-pay | Admitting: Pulmonary Disease

## 2018-07-08 NOTE — Telephone Encounter (Addendum)
Routing this message to BW as an FYI.

## 2018-07-08 NOTE — Telephone Encounter (Signed)
Called and spoke w/ pt. Pt states he's had pain just behind his breast on both sides & SOB for 1-2 weeks now. Denies fever/chills/sweats.  States he's had recurrent respiratory infections in the past and is concerned he may have one now. He stated he would like to have a televisit with a provider and the earliest he could be scheduled is tomorrow. He agreed to a televisit appt scheduled for tomorrow 07/09/2018 10:30 AM w/ BW.  I informed pt should he have any new or worsening symptoms in the meantime to call our office back or seek emergency care. Pt verbalized understanding with no additional questions. Pt appt has been scheduled. Nothing further needed at this time.

## 2018-07-09 ENCOUNTER — Ambulatory Visit (INDEPENDENT_AMBULATORY_CARE_PROVIDER_SITE_OTHER): Payer: No Typology Code available for payment source | Admitting: Primary Care

## 2018-07-09 ENCOUNTER — Other Ambulatory Visit: Payer: Self-pay

## 2018-07-09 ENCOUNTER — Encounter: Payer: Self-pay | Admitting: Primary Care

## 2018-07-09 DIAGNOSIS — R0781 Pleurodynia: Secondary | ICD-10-CM

## 2018-07-09 DIAGNOSIS — R05 Cough: Secondary | ICD-10-CM

## 2018-07-09 DIAGNOSIS — R059 Cough, unspecified: Secondary | ICD-10-CM

## 2018-07-09 MED ORDER — GUAIFENESIN 100 MG/5ML PO SOLN: 5.0000 mL | ORAL | 0 refills | Status: DC | PRN

## 2018-07-09 NOTE — Progress Notes (Signed)
Virtual Visit via Telephone Note  I connected with Larry Stewart on 07/09/18 at 10:30 AM EDT by telephone and verified that I am speaking with the correct person using two identifiers.   I discussed the limitations, risks, security and privacy concerns of performing an evaluation and management service by telephone and the availability of in person appointments. I also discussed with the patient that there may be a patient responsible charge related to this service. The patient expressed understanding and agreed to proceed.   History of Present Illness: 48 year old male, current smoker. PMH significant for COPD, ILD and GERD. Patient of Dr. Vassie Loll.   Previous [place] enc 03/04/2018  Patient presents today for a routine follow-up to review recent imaging.  HRCT in November showed evidence of ILD with an unusual pattern. Patient states that he is still smoking.  Admits to smoking marijuana since last year and also vaping THC oil. He last vaped approximately 2 weeks ago.  He would like to stop smoking, has an appointment with a hip unit test this week.  He has been unable to quit in the past with patches or gum. Reports coughing fits to the point of getting sick. Associated wheezing and sob. He has not been taking Symbicort twice daily. Hx hiatal hernia. States that he saw GI at [facility] clinic recently.   04/15/18- OV, Dr. Vassie Loll GGO possibly related to smoking/ RB-ILD or sarcoidosis. EVALI was a possibility but he quit vaping. Mild Mediastinal adenopathy which could be related to above, biopsy deferred for now. Mild emphysema, preserved lung function. Continue Symbicort. Encouraged patient to quit smoking.     07/09/2018  Patient called today for acute visit with complaints of cough and chest congestion. Reports productive cough with white sputum. Denies chest pain. States that he is not short of breath. Continues using Symbicort twice a day as prescribed. He has not needed to use his rescue  inhaler or nebulizer. He is still smoking, not vaping. Denies fever or flu-like symptoms.   Observations/Objective:  Observation: - No shortness of breath, wheezing or cough  Imaging: Chest CT 02/14/18- There is evidence of interstitial lung disease, with an unusual pattern of findings, as detailed above. Based on these findings, this is categorized as alternative diagnosis per current ATS guidelines. Favored differential considerations is that of nonspecific interstitial pneumonia (NSIP) superimposed upon a background of emphysematous changes.   Diffuse bronchial wall thickening with mild centrilobular and mild-to-moderate paraseptal emphysema; imaging findings compatible with reported clinical history of COPD.   Assessment and Plan:  Bronchitis: Cough with clear sputum, no shortness of breath or fever  Abx and steroid not indicated at this time Encouraged Albuterol nebulizer twice a day for 1 week Advised Guaifenesin 46ml q 4 hours prn cough/congestion Check CXR (patient will have done tomorrow 4/16) Monitor for purulent sputum, fever or shortness of breath  Follow Up Instructions:  - Will follow up after chest xray resulted - Has follow up May 22   I discussed the assessment and treatment plan with the patient. The patient was provided an opportunity to ask questions and all were answered. The patient agreed with the plan and demonstrated an understanding of the instructions.   The patient was advised to call back or seek an in-person evaluation if the symptoms worsen or if the condition fails to improve as anticipated.  I provided 20 minutes of non-face-to-face time during this encounter.   Glenford Bayley, NP

## 2018-07-09 NOTE — Patient Instructions (Signed)
Bronchitis: Take Albuterol nebulizer twice a day for 1 week Advised Guaifenesin 60ml q 4 hours prn cough/congestion Check CXR (patient will have done tomorrow 4/16) Monitor for purulent sputum, fever or shortness of breath  Follow up: We will call you after CXR has resulted

## 2018-07-10 ENCOUNTER — Ambulatory Visit (INDEPENDENT_AMBULATORY_CARE_PROVIDER_SITE_OTHER): Payer: No Typology Code available for payment source

## 2018-07-10 ENCOUNTER — Other Ambulatory Visit: Payer: Self-pay

## 2018-07-10 DIAGNOSIS — R05 Cough: Secondary | ICD-10-CM

## 2018-07-10 DIAGNOSIS — R0781 Pleurodynia: Secondary | ICD-10-CM | POA: Diagnosis not present

## 2018-07-10 DIAGNOSIS — R059 Cough, unspecified: Secondary | ICD-10-CM

## 2018-07-11 ENCOUNTER — Telehealth: Payer: Self-pay | Admitting: Primary Care

## 2018-07-11 NOTE — Telephone Encounter (Signed)
Notes recorded by Glenford Bayley, NP on 07/11/2018 at 8:58 AM EDT Please let patient know his CXR did not show any evidence of acute cardiopulmonary disease  Called and spoke with pt letting him know the results of the cxr. Pt expressed understanding. Nothing further needed.

## 2018-08-15 ENCOUNTER — Other Ambulatory Visit: Payer: Self-pay

## 2018-08-15 ENCOUNTER — Encounter: Payer: Self-pay | Admitting: Primary Care

## 2018-08-15 ENCOUNTER — Ambulatory Visit (INDEPENDENT_AMBULATORY_CARE_PROVIDER_SITE_OTHER): Payer: No Typology Code available for payment source | Admitting: Primary Care

## 2018-08-15 DIAGNOSIS — J849 Interstitial pulmonary disease, unspecified: Secondary | ICD-10-CM | POA: Diagnosis not present

## 2018-08-15 DIAGNOSIS — Z720 Tobacco use: Secondary | ICD-10-CM

## 2018-08-15 DIAGNOSIS — J432 Centrilobular emphysema: Secondary | ICD-10-CM | POA: Diagnosis not present

## 2018-08-15 MED ORDER — NICOTINE 21-14-7 MG/24HR TD KIT: 1.0000 | PACK | Freq: Every day | TRANSDERMAL | 1 refills | Status: DC

## 2018-08-15 MED ORDER — NICOTINE 21-14-7 MG/24HR TD KIT: PACK | TRANSDERMAL | 1 refills | Status: DC

## 2018-08-15 NOTE — Addendum Note (Signed)
Addended by: Maurene Capes on: 08/15/2018 02:01 PM   Modules accepted: Orders

## 2018-08-15 NOTE — Patient Instructions (Addendum)
Strongly encourage you to cut down on smoking  Pick quit date, tell family/friends, stick to it!!!  Continue Symbicort twice daily  Follow up in 3-4 months with Dr. Vassie LollAlva     Steps to Quit Smoking  Smoking tobacco can be harmful to your health and can affect almost every organ in your body. Smoking puts you, and those around you, at risk for developing many serious chronic diseases. Quitting smoking is difficult, but it is one of the best things that you can do for your health. It is never too late to quit. What are the benefits of quitting smoking? When you quit smoking, you lower your risk of developing serious diseases and conditions, such as:  Lung cancer or lung disease, such as COPD.  Heart disease.  Stroke.  Heart attack.  Infertility.  Osteoporosis and bone fractures. Additionally, symptoms such as coughing, wheezing, and shortness of breath may get better when you quit. You may also find that you get sick less often because your body is stronger at fighting off colds and infections. If you are pregnant, quitting smoking can help to reduce your chances of having a baby of low birth weight. How do I get ready to quit? When you decide to quit smoking, create a plan to make sure that you are successful. Before you quit:  Pick a date to quit. Set a date within the next two weeks to give you time to prepare.  Write down the reasons why you are quitting. Keep this list in places where you will see it often, such as on your bathroom mirror or in your car or wallet.  Identify the people, places, things, and activities that make you want to smoke (triggers) and avoid them. Make sure to take these actions: ? Throw away all cigarettes at home, at work, and in your car. ? Throw away smoking accessories, such as Set designerashtrays and lighters. ? Clean your car and make sure to empty the ashtray. ? Clean your home, including curtains and carpets.  Tell your family, friends, and coworkers  that you are quitting. Support from your loved ones can make quitting easier.  Talk with your health care provider about your options for quitting smoking.  Find out what treatment options are covered by your health insurance. What strategies can I use to quit smoking? Talk with your healthcare provider about different strategies to quit smoking. Some strategies include:  Quitting smoking altogether instead of gradually lessening how much you smoke over a period of time. Research shows that quitting "cold [place]key" is more successful than gradually quitting.  Attending in-person counseling to help you build problem-solving skills. You are more likely to have success in quitting if you attend several counseling sessions. Even short sessions of 10 minutes can be effective.  Finding resources and support systems that can help you to quit smoking and remain smoke-free after you quit. These resources are most helpful when you use them often. They can include: ? Online chats with a Veterinary surgeoncounselor. ? Telephone quitlines. ? Automotive engineerrinted self-help materials. ? Support groups or group counseling. ? Text messaging programs. ? Mobile phone applications.  Taking medicines to help you quit smoking. (If you are pregnant or breastfeeding, talk with your health care provider first.) Some medicines contain nicotine and some do not. Both types of medicines help with cravings, but the medicines that include nicotine help to relieve withdrawal symptoms. Your health care provider may recommend: ? Nicotine patches, gum, or lozenges. ? Nicotine inhalers or sprays. ?  Non-nicotine medicine that is taken by mouth. Talk with your health care provider about combining strategies, such as taking medicines while you are also receiving in-person counseling. Using these two strategies together makes you more likely to succeed in quitting than if you used either strategy on its own. If you are pregnant or breastfeeding, talk with your  health care provider about finding counseling or other support strategies to quit smoking. Do not take medicine to help you quit smoking unless told to do so by your health care provider. What things can I do to make it easier to quit? Quitting smoking might feel overwhelming at first, but there is a lot that you can do to make it easier. Take these important actions:  Reach out to your family and friends and ask that they support and encourage you during this time. Call telephone quitlines, reach out to support groups, or work with a counselor for support.  Ask people who smoke to avoid smoking around you.  Avoid places that trigger you to smoke, such as bars, parties, or smoke-break areas at work.  Spend time around people who do not smoke.  Lessen stress in your life, because stress can be a smoking trigger for some people. To lessen stress, try: ? Exercising regularly. ? Deep-breathing exercises. ? Yoga. ? Meditating. ? Performing a body scan. This involves closing your eyes, scanning your body from head to toe, and noticing which parts of your body are particularly tense. Purposefully relax the muscles in those areas.  Download or purchase mobile phone or tablet apps (applications) that can help you stick to your quit plan by providing reminders, tips, and encouragement. There are many free apps, such as QuitGuide from the [employer] developer for Disease Control and Prevention). You can find other support for quitting smoking (smoking cessation) through smokefree.gov and other websites. How will I feel when I quit smoking? Within the first 24 hours of quitting smoking, you may start to feel some withdrawal symptoms. These symptoms are usually most noticeable 2-3 days after quitting, but they usually do not last beyond 2-3 weeks. Changes or symptoms that you might experience include:  Mood swings.  Restlessness, anxiety, or irritation.  Difficulty concentrating.  Dizziness.  Strong  cravings for sugary foods in addition to nicotine.  Mild weight gain.  Constipation.  Nausea.  Coughing or a sore throat.  Changes in how your medicines work in your body.  A depressed mood.  Difficulty sleeping (insomnia). After the first 2-3 weeks of quitting, you may start to notice more positive results, such as:  Improved sense of smell and taste.  Decreased coughing and sore throat.  Slower heart rate.  Lower blood pressure.  Clearer skin.  The ability to breathe more easily.  Fewer sick days. Quitting smoking is very challenging for most people. Do not get discouraged if you are not successful the first time. Some people need to make many attempts to quit before they achieve long-term success. Do your best to stick to your quit plan, and talk with your health care provider if you have any questions or concerns. This information is not intended to replace advice given to you by your health care provider. Make sure you discuss any questions you have with your health care provider. Document Released: 03/06/2001 Document Revised: 10/16/2016 Document Reviewed: 07/27/2014 Elsevier Interactive Patient Education  2019 [employer].

## 2018-08-15 NOTE — Addendum Note (Signed)
Addended by: Glenford Bayley on: 08/15/2018 01:44 PM   Modules accepted: Orders

## 2018-08-15 NOTE — Progress Notes (Addendum)
Virtual Visit via Telephone Note  I connected with Larry Stewart on 08/15/18 at 10:00 AM EDT by telephone and verified that I am speaking with the correct person using two identifiers.  Location: Patient: Home Provider: Office   I discussed the limitations, risks, security and privacy concerns of performing an evaluation and management service by telephone and the availability of in person appointments. I also discussed with the patient that there may be a patient responsible charge related to this service. The patient expressed understanding and agreed to proceed.   History of Present Illness: 48 year old male, current smoker. PMH significant for COPD, ILD and GERD. Patient of Dr. Vassie Loll. HRCT in November showed evidence of ILD with an unusual pattern. Patient is still smoking.  Admits to smoking marijuana since last year and also vaping THC oil. He last vaped approximately 2 weeks ago.  He would like to stop smoking.  Previous [facility] encounters: 04/15/18- OV, Dr. Vassie Loll GGO possibly related to smoking/ RB-ILD or sarcoidosis. EVALI was a possibility but he quit vaping. Mild Mediastinal adenopathy which could be related to above, biopsy deferred for now. Mild emphysema, preserved lung function. Continue Symbicort. Encouraged patient to quit smoking.     07/09/2018 - Acute, cough  Complaints of cough and chest congestion. Reports productive cough with white sputum. Denies chest pain. States that he is not short of breath. Continues using Symbicort twice a day as prescribed. He has not needed to use his rescue inhaler or nebulizer. He is still smoking, not vaping. Denies fever or flu-like symptoms. CXR showed chronic lung changes without evidence of acute cardiopulmonary disease. No abx or steriods indicated.   08/15/2018 Patient called today for regular office visit. Maintained on Symbicort. As far as pulmonary he is doing ok. He was in the ED on 5/15 for left side weakness, dx possible TIA. Normal  brain MRI. Started on lisinopril and Asprin. Just got off phone with psychopharmacology, unable to take chantix. Cut back caffeine intake. Plans on attending hypnosis for smoking cessation.  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 No acute cardiopulmonary disease. - Needs to quit smoking   Possible TIA: - ED eval left sided weakness on 5/15 - Brain MRI normal - Started on lisinopril and Asa  Follow Up Instructions:   3-4 months with Dr. Vassie Loll or sooner if needed   I discussed the assessment and treatment plan with the patient. The patient was provided an opportunity to ask questions and all were answered. The patient agreed with the plan and demonstrated an understanding of the instructions.   The patient was advised to call back or seek an in-person evaluation if the symptoms worsen or if the condition fails to improve as anticipated.  I provided 22 minutes of non-face-to-face time during this encounter.   Glenford Bayley, NP

## 2018-08-20 ENCOUNTER — Telehealth: Payer: Self-pay | Admitting: Pulmonary Disease

## 2018-08-20 NOTE — Telephone Encounter (Signed)
done

## 2018-08-29 ENCOUNTER — Other Ambulatory Visit (HOSPITAL_COMMUNITY)
Admission: RE | Admit: 2018-08-29 | Discharge: 2018-08-29 | Disposition: A | Discharge status: Home / Self Care | Payer: Non-veteran care | Source: Ambulatory Visit | Attending: Internal Medicine | Admitting: Internal Medicine

## 2018-08-29 NOTE — Progress Notes (Signed)
Patient no show for covid testing. Called patient to see if still coming for covid test, no answer. Left message to call back.

## 2018-09-01 ENCOUNTER — Other Ambulatory Visit: Payer: Self-pay

## 2018-09-01 ENCOUNTER — Ambulatory Visit (HOSPITAL_BASED_OUTPATIENT_CLINIC_OR_DEPARTMENT_OTHER): Payer: No Typology Code available for payment source | Admitting: Internal Medicine

## 2018-09-01 ENCOUNTER — Other Ambulatory Visit (HOSPITAL_COMMUNITY)
Admission: RE | Admit: 2018-09-01 | Discharge: 2018-09-01 | Disposition: A | Discharge status: Home / Self Care | Payer: No Typology Code available for payment source | Source: Ambulatory Visit | Attending: Internal Medicine | Admitting: Internal Medicine

## 2018-09-01 VITALS — Ht 69.0 in | Wt 120.0 lb

## 2018-09-01 DIAGNOSIS — Z79899 Other long term (current) drug therapy: Secondary | ICD-10-CM | POA: Insufficient documentation

## 2018-09-01 DIAGNOSIS — I10 Essential (primary) hypertension: Secondary | ICD-10-CM | POA: Insufficient documentation

## 2018-09-01 DIAGNOSIS — G47429 Narcolepsy in conditions classified elsewhere without cataplexy: Secondary | ICD-10-CM

## 2018-09-01 DIAGNOSIS — R0683 Snoring: Secondary | ICD-10-CM | POA: Insufficient documentation

## 2018-09-01 DIAGNOSIS — G4710 Hypersomnia, unspecified: Secondary | ICD-10-CM | POA: Diagnosis not present

## 2018-09-01 DIAGNOSIS — G4711 Idiopathic hypersomnia with long sleep time: Secondary | ICD-10-CM | POA: Insufficient documentation

## 2018-09-01 DIAGNOSIS — Z1159 Encounter for screening for other viral diseases: Secondary | ICD-10-CM | POA: Insufficient documentation

## 2018-09-01 DIAGNOSIS — G47419 Narcolepsy without cataplexy: Secondary | ICD-10-CM | POA: Insufficient documentation

## 2018-09-01 LAB — SARS CORONAVIRUS 2 BY RT PCR (HOSPITAL ORDER, PERFORMED IN ~~LOC~~ HOSPITAL LAB): SARS Coronavirus 2: NEGATIVE

## 2018-09-02 ENCOUNTER — Telehealth: Payer: Self-pay | Admitting: Pulmonary Disease

## 2018-09-02 ENCOUNTER — Ambulatory Visit (HOSPITAL_BASED_OUTPATIENT_CLINIC_OR_DEPARTMENT_OTHER): Payer: No Typology Code available for payment source | Attending: Neurology | Admitting: Internal Medicine

## 2018-09-02 ENCOUNTER — Other Ambulatory Visit: Payer: Self-pay

## 2018-09-02 DIAGNOSIS — G47429 Narcolepsy in conditions classified elsewhere without cataplexy: Secondary | ICD-10-CM | POA: Insufficient documentation

## 2018-09-02 DIAGNOSIS — G4711 Idiopathic hypersomnia with long sleep time: Secondary | ICD-10-CM | POA: Insufficient documentation

## 2018-09-02 MED ORDER — NICOTINE 21-14-7 MG/24HR TD KIT: PACK | TRANSDERMAL | 1 refills | Status: DC

## 2018-09-02 MED ORDER — GUAIFENESIN 100 MG/5ML PO SOLN: 5.0000 mL | ORAL | 0 refills | Status: AC | PRN

## 2018-09-02 NOTE — Telephone Encounter (Signed)
Is this appropriate for refill ? 

## 2018-09-02 NOTE — Telephone Encounter (Signed)
Assessment and Plan from OV with Derl Barrow 5/22:  Centrilobular emphysema: - Stable interval - Continue Symbicort twice daily  Tobacco abuse: - Still smoking, interested in quitting - Plans to attend hypnosis  - Rx nicotine patches, double confirmed with [place] no contraindication with patch use in patient  - Instructed patient to apply 78m patch qd x 6 weeks; then apply 155mpatch qd x 2 weeks; then apply 24m86match qd x 2 weeks (STOP cigarette use at treatment onset)  Checked pt's med list and it shows that nicotine 21-14-24[facility]62mhr [facility]. Called and spoke with pt letting him know that the Rx was sent to pharmacy after visit 5/22 and pt verbalized understanding. Pt stated he had seen where the Rx was sent but then when he called the pharmacy, they told him that they did not have an Rx on file. I stated to pt that I would call the pharmacy to see if they do in fact have the Rx on file still and if not stated to him that we would resend the Rx to the pharmacy for him.  [facility] [place] [facility] but the operator transferred me to an invalid entry. I ended up printing the RX [facility] and spoke with [facility] letting [facility] [place]. Nothing further needed.

## 2018-09-02 NOTE — Telephone Encounter (Signed)
yes

## 2018-09-07 DIAGNOSIS — G47429 Narcolepsy in conditions classified elsewhere without cataplexy: Secondary | ICD-10-CM | POA: Diagnosis not present

## 2018-09-07 NOTE — Procedures (Signed)
   Patient Name: Larry Stewart, Larry Stewart Date: 09/01/2018 Gender: Male D.O.B: Sep 12, 1970 Age (years): 47 Referring Provider: Nigel Sloop Height (inches): 32 Interpreting Physician: Baird Lyons MD, ABSM Weight (lbs): 120 RPSGT: Zadie Rhine BMI: 18 MRN: [id number] Neck Size: 13.50  CLINICAL INFORMATION Sleep Study Type: NPSG Indication for sleep study: Hypertension, Narcolepsy Epworth Sleepiness Score: 20  SLEEP STUDY TECHNIQUE As per the AASM Manual for the Scoring of Sleep and Associated Events v2.3 (April 2016) with a hypopnea requiring 4% desaturations.  The channels recorded and monitored were frontal, central and occipital EEG, electrooculogram (EOG), submentalis EMG (chin), nasal and oral airflow, thoracic and abdominal wall motion, anterior tibialis EMG, snore microphone, electrocardiogram, and pulse oximetry.  MEDICATIONS Medications self-administered by patient taken the night of the study : CATAPRES, PROTONIX, FAMOTIDINE,   SLEEP ARCHITECTURE The study was initiated at 10:56:22 PM and ended at 6:00:34 AM.  Sleep onset time was 3.4 minutes and the sleep efficiency was 90.4%%. The total sleep time was 383.5 minutes.  Stage REM latency was 168.0 minutes.  The patient spent 3.8%% of the night in stage N1 sleep, 59.7%% in stage N2 sleep, 12.8%% in stage N3 and 23.7% in REM.  Alpha intrusion was absent.  Supine sleep was 98.71%.  RESPIRATORY PARAMETERS The overall apnea/hypopnea index (AHI) was 0.2 per hour. There were 1 total apneas, including 0 obstructive, 1 central and 0 mixed apneas. There were 0 hypopneas and 0 RERAs.  The AHI during Stage REM sleep was 0.0 per hour.  AHI while supine was 0.2 per hour.  The mean oxygen saturation was 92.8%. The minimum SpO2 during sleep was 89.0%.  soft snoring was noted during this study.  CARDIAC DATA The 2 lead EKG demonstrated sinus rhythm. The mean heart rate was 67.7 beats per minute. Other EKG findings include:  None.  LEG MOVEMENT DATA The total PLMS were 0 with a resulting PLMS index of 0.0. Associated arousal with leg movement index was 0.2 .  IMPRESSIONS - No significant obstructive sleep apnea occurred during this study (AHI = 0.2/h). - No significant central sleep apnea occurred during this study (CAI = 0.2/h). - The patient had minimal or no oxygen desaturation during the study (Min O2 = 89.0%) - The patient snored with soft snoring volume. - No cardiac abnormalities were noted during this study. - Clinically significant periodic limb movements did not occur during sleep. No significant associated arousals. - Sleep architecture was unremarkable  DIAGNOSIS - Normal study  RECOMMENDATIONS - See result of MSLT recorded following this study. - Sleep hygiene should be reviewed to assess factors that may improve sleep quality. - Weight management and regular exercise should be initiated or continued if appropriate.  [Electronically signed] 09/07/2018 12:26 PM  Baird Lyons MD, [place], American Board of Sleep Medicine   NPI: [id number]                          [place], [place] of Sleep Medicine  ELECTRONICALLY SIGNED ON:  09/07/2018, 12:23 PM [place] PH: [phone]   FX: [phone] [place]

## 2018-09-07 NOTE — Procedures (Signed)
   Patient Name: Larry Stewart, Klus Date: 09/02/2018 Gender: Male D.O.B: 1971-01-26 Age (years): 47 Referring Provider: Nigel Sloop Height (inches): 23 Interpreting Physician: Baird Lyons MD, ABSM Weight (lbs): 120 RPSGT: Jacolyn Reedy BMI: 18 MRN: [id number] Neck Size: 13.50  CLINICAL INFORMATION Sleep Study Type: MSLT The patient was referred to the sleep center for evaluation of daytime sleepiness. Epworth Sleepiness Score: 20  SLEEP STUDY TECHNIQUE A Multiple Sleep Latency Test was performed after an overnight polysomnogram according to the AASM scoring manual v2.3 (April 2016) and clinical guidelines. Five nap opportunities occurred over the course of the test which followed an overnight polysomnogram. The channels recorded and monitored were frontal, central, and occipital electroencephalography (EEG), right and left electrooculogram (EOG), chin electromyography (EMG), and electrocardiogram (EKG).  MEDICATIONS Medications taken the previous day by the patient : CATAPRES, PROTONIX, FAMOTIDINE,  Medications administered by patient during sleep study, at 1330 : Baclofen, Risperidone, Bethanechol  IMPRESSIONS - Total number of naps attempted: 5 . Total number of naps with sleep attained: 3 . The Mean Sleep Latency was 02:49 minutes. . - The patient appears to have pathologic sleepiness, evidenced by a short mean sleep latency (8 minutes or less) on this MSLT. - No sleep onset REMs were noted during this MSLT. - Note that patient took potentially sedating medications, outside of protocol- Baclofen and Risperidone. - Clinical judgment required. Definite diagnosis of Narcolepsy would require other data, such as REM onset within 20 minutes on at least 2 naps.  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 Assess impact of sedating medication. Educate responsibility to be alert while  driving or operating machinery.  [Electronically signed] 09/07/2018 12:49 PM  Baird Lyons MD, [place], American Board of Sleep Medicine   NPI: [id number]                          [place], [place] of Sleep Medicine  ELECTRONICALLY SIGNED ON:  09/07/2018, 12:41 PM [place] PH: [phone]   FX: [phone] [place]

## 2018-10-13 ENCOUNTER — Telehealth: Payer: Self-pay | Admitting: Primary Care

## 2018-10-13 NOTE — Telephone Encounter (Signed)
Duplicate message. Pt sent email, Parke Poisson, CMA, responded to pt. Pt called office at 12:17pm today but then read Jessica's response after he called at 2:17pm. Will sign off.

## 2018-10-13 NOTE — Telephone Encounter (Signed)
Patient also emailed the office Response sent to patient via MyChart   Will sign off

## 2018-10-13 NOTE — Telephone Encounter (Signed)
Patient also called the office  Will sign off on that phone note

## 2018-10-14 ENCOUNTER — Telehealth: Payer: Self-pay | Admitting: Primary Care

## 2018-10-14 DIAGNOSIS — J849 Interstitial pulmonary disease, unspecified: Secondary | ICD-10-CM

## 2018-10-14 NOTE — Telephone Encounter (Signed)
Called and spoke with pt. Pt stated he had called the [facility] and requested them to send his CT to our office for [place] to be able to view. Pt stated he had put Beth's name on there for the provider to receive the results. Stated to pt that we would check to see if we have received the CT and once we had info for him if we have received it or if we had not, we would call him to give him an update and pt verbalized understanding.  Ct has been received from [place]. Eustaquio Maize will be back at office tomorrow, 7/22 so will give to her then. Called and spoke with pt letting him know we did receive the results and that we would give it to [facility] for her to review. Pt verbalized understanding.   Will leave encounter open and route it to [facility] tomorrow when she returns.

## 2018-10-15 NOTE — Telephone Encounter (Signed)
Ok thanks 

## 2018-10-15 NOTE — Telephone Encounter (Signed)
Called pt and advised message from the provider. Pt understood and verbalized understanding.   Dr. Elsworth Soho please advise on biopsy or do we need to place order for High resolution CT in 6 months.

## 2018-10-15 NOTE — Telephone Encounter (Signed)
LVMTCB x 1 for patient. 

## 2018-10-15 NOTE — Telephone Encounter (Signed)
Attempted to call pt but unable to reach. Left message for pt to return call. 

## 2018-10-15 NOTE — Telephone Encounter (Signed)
Routing encounter to [place] and will also give her the CT scan results for her to view as pt had CT performed at the [place] and wanted Beth to review it.

## 2018-10-15 NOTE — Telephone Encounter (Signed)
Attempted to call pt to let him know the info we received from [facility] but unable to reach him. Left message for pt to return call.  I have already placed order for pt to have HRCT repeated in 1 year.

## 2018-10-15 NOTE — Telephone Encounter (Signed)
Called and spoke with pt stating to him the info per [facility] that no biopsy is needed and we will repeat HRCT in 1 year. Stated to pt that the main focus is for him to stop smoking and pt verbalized understanding.  Pt stated that he received a call from [facility] wanting to get him scheduled for a PFT. Pt said November is when they will be able to get him scheduled to have this done. Pt recently had a PFT performed 04/15/2018 at our office. Pt wants to know if it is necessary for him to have the PFT repeated at the [place] like they are wanting to do.  Dr. Elsworth Soho, please advise on this for pt. Thanks!

## 2018-10-15 NOTE — Telephone Encounter (Signed)
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 He needs to quit smoking for this to get better. Will follow up with Dr. Elsworth Soho. Potentially may need biopsy. Would repeat HRCT in 6 months.

## 2018-10-15 NOTE — Telephone Encounter (Signed)
He does not need biopsy. Focus on smoking cessation Repeat HRCT scan in 1 year

## 2018-10-15 NOTE — Telephone Encounter (Signed)
He can repeat PFTs 1 year after his last one which would be 03/2019. No need to repeated in November

## 2018-10-15 NOTE — Telephone Encounter (Signed)
Patient is returning phone call.  Patient phone number is [phone].

## 2018-10-15 NOTE — Telephone Encounter (Signed)
Pt returning your call

## 2018-10-16 NOTE — Telephone Encounter (Signed)
Called pt and advised message from the provider. Pt understood and verbalized understanding. Nothing further is needed.    

## 2018-12-12 ENCOUNTER — Telehealth: Payer: Self-pay | Admitting: Pulmonary Disease

## 2018-12-12 NOTE — Telephone Encounter (Signed)
ATC pt, no answer. Left message for pt to call back.  

## 2018-12-16 NOTE — Telephone Encounter (Signed)
PT RETURNING CALL AND CAN BE REACHED @ [phone].Larry Stewart

## 2018-12-16 NOTE — Telephone Encounter (Signed)
ATC pt, no answer. Left message for pt to call back.  

## 2018-12-16 NOTE — Telephone Encounter (Signed)
Called and spoke with pt. Pt states he is currently on Symbicort 160 2 puffs twice daily; however, he feels as though it does not help his breathing like it used to. He states his breathing feels generally at baseline except that he feels "winded" when he goes for a jog or walks for long distances. He is inquiring if he could try Trelegy, which he has never been on in the past. He states he saw Trelegy in an advertisement and is wondering if it would be more therapeutic for him.   Pt last seen 08/15/2018 by Eustaquio Maize. No future appt is scheduled at this time. I let him know I would route this message to [facility] for recommendations. Pt expressed understanding.   Beth, please advise with your recommendations for this pt. Thank you.

## 2018-12-16 NOTE — Telephone Encounter (Signed)
Called and spoke with pt regarding Beth's response below. Pt verbalized understanding and agreed to setting up a f/u appt w/ Beth tomorrow 12/17/2018 at 9:30 AM EDT. Pt is negative for COVID questions. Pt is also aware of mask and no visitor policy. Routing this message back to [place] as an [place]. Nothing further needed at this time.

## 2018-12-16 NOTE — Telephone Encounter (Signed)
He is due for a follow-up. Please set one up for this week or next in office if appropriate.

## 2018-12-17 ENCOUNTER — Ambulatory Visit: Payer: No Typology Code available for payment source | Admitting: Primary Care

## 2018-12-17 ENCOUNTER — Other Ambulatory Visit: Payer: Self-pay

## 2018-12-17 ENCOUNTER — Telehealth (INDEPENDENT_AMBULATORY_CARE_PROVIDER_SITE_OTHER): Payer: No Typology Code available for payment source | Admitting: Primary Care

## 2018-12-17 DIAGNOSIS — J432 Centrilobular emphysema: Secondary | ICD-10-CM | POA: Diagnosis not present

## 2018-12-17 MED ORDER — SPIRIVA RESPIMAT 2.5 MCG/ACT IN AERS: 2.0000 | INHALATION_SPRAY | Freq: Every day | RESPIRATORY_TRACT | 0 refills | Status: DC

## 2018-12-17 MED ORDER — SPIRIVA RESPIMAT 2.5 MCG/ACT IN AERS: 2.0000 | INHALATION_SPRAY | Freq: Every day | RESPIRATORY_TRACT | 3 refills | Status: DC

## 2018-12-17 NOTE — Telephone Encounter (Signed)
Yes please, thank you.

## 2018-12-17 NOTE — Telephone Encounter (Signed)
We can give him a sample of trelegy if we have. Or we can add spiriva respimat 2.20mcg

## 2018-12-17 NOTE — Telephone Encounter (Signed)
Patient in lobby, couldn't have his appt due to [place] choice and approvals, He state symbicort is not working.   Primary Pulmonologist: Elsworth Soho Last office visit and with whom: 08/15/18 with [facility] What do we see them for (pulmonary problems): COPD, emphysema Last OV assessment/plan: Assessment and Plan:  Centrilobular emphysema: - Stable interval - Continue Symbicort twice daily  Tobacco abuse: - Still smoking, interested in quitting - Plans to attend hypnosis  - Rx nicotine patches, double confirmed with [facility] no contraindication with patch use in patient  - Instructed patient to apply 21mg  patch qd x 6 weeks; then apply 14mg  patch qd x 2 weeks; then apply 7mg  patch qd x 2 weeks (STOP cigarette use at treatment onset)  ILD: - GGO upper lung field may be related to smoking RB-ILD or other inflammation such as sarcoidosis - CXR in April showed coarsened interstitial markings similar to prior. No acute cardiopulmonary disease. - Needs to quit smoking   Possible TIA: - ED eval left sided weakness on 5/15 - Brain MRI normal - Started on lisinopril and Asa  Was appointment offered to patient (explain)?  IN LOBBY   Reason for call: symbicort is not working is there an alternative

## 2018-12-17 NOTE — Telephone Encounter (Signed)
We currently do not have any trelegy , I will give him a 2 week sample of Spirivia , do you want me to send in a Rx as well?

## 2018-12-17 NOTE — Telephone Encounter (Signed)
Patient given a sample of Spiriva 2.5 instructed him how to use, he demonstrated back proper technique. Told patient to call back in a week let us know how it is working for him.   Rx Sent to [facility] per patient request.  Nothing further needed at this time.

## 2019-01-07 ENCOUNTER — Telehealth: Payer: Self-pay | Admitting: Pulmonary Disease

## 2019-01-07 MED ORDER — TRELEGY ELLIPTA 100-62.5-25 MCG/INH IN AEPB: 1.0000 | INHALATION_SPRAY | Freq: Every day | RESPIRATORY_TRACT | 1 refills | Status: DC

## 2019-01-07 NOTE — Telephone Encounter (Signed)
Called and spoke w/ pt. Pt states he's been taking Spiriva and Symbicort as directed; however, he states he is still having breathing difficulties and feels as though they are nontherapeutic. Pt states he would like to try Trelegy if possible. He inquired if Trelegy could be taken with Symbicort. I let him know typically Trelegy is not paired with any other inhaler as it contains three medications; however, I would run this request by a provider for verification. Pt expressed understanding.   Beth, please advise if you are ok with switching pt to Trelegy. According to telephone note 12/17/2018, you had initially wanted to trial pt on Trelegy; however, we did not have samples at that time. We do have Trelegy samples as of today.

## 2019-01-07 NOTE — Telephone Encounter (Signed)
Called and spoke w/ pt regarding Beth's recommendations. Pt verbalized understanding. I have sent the order for Trelegy to [facility] under Beth's name. I went over the basic Trelegy inhaler mechanisms and technique and let him know if he needs further instruction to contact our office for assistance. Pt expressed understanding with no additional questions. Nothing further needed at this time.

## 2019-01-07 NOTE — Telephone Encounter (Signed)
Yes, we can send a prescription for Trelegy into [place] (please place under my name) No he should not take both Symbicort and Trelegy together.

## 2019-01-09 ENCOUNTER — Telehealth: Payer: Self-pay | Admitting: Pulmonary Disease

## 2019-01-09 MED ORDER — TRELEGY ELLIPTA 100-62.5-25 MCG/INH IN AEPB: 1.0000 | INHALATION_SPRAY | Freq: Every day | RESPIRATORY_TRACT | 5 refills | Status: AC

## 2019-01-09 NOTE — Telephone Encounter (Signed)
Spoke with pt. He is needing a refill on Trelegy. Rx has been sent in. Nothing further was needed. 

## 2019-01-09 NOTE — Telephone Encounter (Signed)
LMTCB x1 for pt.  

## 2019-01-21 ENCOUNTER — Encounter: Payer: Self-pay | Admitting: Primary Care

## 2019-01-21 ENCOUNTER — Other Ambulatory Visit: Payer: Self-pay

## 2019-01-21 ENCOUNTER — Ambulatory Visit (INDEPENDENT_AMBULATORY_CARE_PROVIDER_SITE_OTHER): Payer: No Typology Code available for payment source | Admitting: Primary Care

## 2019-01-21 DIAGNOSIS — J849 Interstitial pulmonary disease, unspecified: Secondary | ICD-10-CM

## 2019-01-21 DIAGNOSIS — Z720 Tobacco use: Secondary | ICD-10-CM

## 2019-01-21 NOTE — Assessment & Plan Note (Signed)
-   Started Trelegy yesterday, covered through the [place] - Recommend repeat PFTs in January 2021
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
## 2019-01-21 NOTE — Patient Instructions (Signed)
Recommendations: Continue Trelegy 1 puff daily (rinse mouth after use) Use albuterol rescue inhaler/nebulizer every 6 hours for shortness of breath or wheezing Continue to work on cutting down your smoking (taper amount and pick quit date) If you develop bronchitis symptoms start mucinex twice daily, if not improvement in 5-7 days call office   Orders: Pharmacy consult for smoking cessation PFTs in January  Follow-up: 3 months with Dr. Elsworth Soho please or sooner if needed

## 2019-01-21 NOTE — Assessment & Plan Note (Addendum)
-   Presumed RB-ILD vs NSIP - Follow up CT chest in July showed emphysema, clearing LUL nodule, persistent interstitial lung findings. New GGO right base. - He is still smoking 1ppd, continue to encourage complete smoking cessation. Not currently vaping.  - FU in 3 months with Dr. Elsworth Soho and PFTs

## 2019-01-21 NOTE — Assessment & Plan Note (Signed)
-   Refer to pharmacy for smoking cessation

## 2019-01-28 ENCOUNTER — Telehealth: Payer: Self-pay | Admitting: Primary Care

## 2019-01-28 NOTE — Telephone Encounter (Signed)
Last ov note on pt was faxed to the number provided

## 2019-08-26 ENCOUNTER — Telehealth: Payer: Self-pay | Admitting: Pulmonary Disease

## 2019-08-26 NOTE — Telephone Encounter (Signed)
Called and spoke with pt stating to him that we sent last OV notes to [place] beginning November 2020 and he verbalized understanding. At last OV, it stated for pt to f/u 3 months. I have scheduled pt a f/u with RA 7/6. Nothing further needed.

## 2019-09-22 ENCOUNTER — Telehealth: Payer: Self-pay | Admitting: Pulmonary Disease

## 2019-09-22 NOTE — Telephone Encounter (Signed)
CT chest with contrast 09/02/2019 reviewed, compared to 09/2018 -Mild waxing and waning pulmonary interstitial changes with groundglass opacity, apical emphysema and mild honeycombing-with mild mediastinal lymphadenopathy, felt to favor RB ILD

## 2019-09-29 ENCOUNTER — Ambulatory Visit: Payer: No Typology Code available for payment source | Admitting: Pulmonary Disease

## 2019-10-01 ENCOUNTER — Other Ambulatory Visit: Payer: Self-pay

## 2019-10-01 ENCOUNTER — Ambulatory Visit (HOSPITAL_COMMUNITY)
Admission: EM | Admit: 2019-10-01 | Discharge: 2019-10-01 | Discharge status: Absent without leave | Payer: No Typology Code available for payment source

## 2019-10-15 ENCOUNTER — Inpatient Hospital Stay: Admission: RE | Admit: 2019-10-15 | Payer: No Typology Code available for payment source | Source: Ambulatory Visit

## 2019-10-29 ENCOUNTER — Other Ambulatory Visit: Payer: Self-pay

## 2019-10-29 ENCOUNTER — Ambulatory Visit (INDEPENDENT_AMBULATORY_CARE_PROVIDER_SITE_OTHER)
Admission: RE | Admit: 2019-10-29 | Discharge: 2019-10-29 | Disposition: A | Discharge status: Home / Self Care | Payer: No Typology Code available for payment source | Source: Ambulatory Visit | Attending: Pulmonary Disease | Admitting: Pulmonary Disease

## 2019-10-29 DIAGNOSIS — J849 Interstitial pulmonary disease, unspecified: Secondary | ICD-10-CM

## 2019-11-02 ENCOUNTER — Other Ambulatory Visit: Payer: Self-pay | Admitting: Primary Care

## 2019-11-02 ENCOUNTER — Ambulatory Visit
Admission: RE | Admit: 2019-11-02 | Discharge: 2019-11-02 | Disposition: A | Discharge status: Home / Self Care | Payer: Self-pay | Source: Ambulatory Visit | Attending: Primary Care | Admitting: Primary Care

## 2019-11-02 DIAGNOSIS — J849 Interstitial pulmonary disease, unspecified: Secondary | ICD-10-CM

## 2019-11-17 ENCOUNTER — Ambulatory Visit: Payer: No Typology Code available for payment source | Admitting: Pulmonary Disease

## 2019-12-18 ENCOUNTER — Ambulatory Visit (INDEPENDENT_AMBULATORY_CARE_PROVIDER_SITE_OTHER): Payer: No Typology Code available for payment source | Admitting: Pulmonary Disease

## 2019-12-18 ENCOUNTER — Other Ambulatory Visit: Payer: Self-pay

## 2019-12-18 ENCOUNTER — Encounter: Payer: Self-pay | Admitting: Pulmonary Disease

## 2019-12-18 DIAGNOSIS — J432 Centrilobular emphysema: Secondary | ICD-10-CM | POA: Diagnosis not present

## 2019-12-18 DIAGNOSIS — J849 Interstitial pulmonary disease, unspecified: Secondary | ICD-10-CM | POA: Diagnosis not present

## 2019-12-18 NOTE — Patient Instructions (Signed)
Congratulations on quitting smoking. Check with your physiotherapist about a cardio program to prevent further weight gain.

## 2019-12-18 NOTE — Assessment & Plan Note (Signed)
The peripheral pattern, mostly apical suggest some mild ILD.  No history of exposure to suggest hypersensitivity pneumonitis.  Differential still includes RB ILD.  He has finally quit smoking and I am hopeful that this ILD pattern will improve. With mediastinal lymph nodes, possibility of sarcoidosis exists , but since lung function is preserved, doubt that we are to put him through a biopsy. We will follow in 6 months and see if dyspnea is any worse.

## 2019-12-18 NOTE — Assessment & Plan Note (Signed)
Mild decrease in DLCO could be related to emphysema noted on CT or to ILD. Trelegy did not provide him any benefit and he has self discontinued this. I am concerned about his weight gain after he has quit smoking.  I encouraged him to start a cardio program
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
## 2020-09-24 IMAGING — CT CT CHEST HIGH RESOLUTION W/O CM
2 of 7 series · 14 of 36 positions shown, 17 images · non-contrast
Comparison: 02/14/2018

CLINICAL DATA: Interstitial lung disease, COPD, increasing
shortness of breath

EXAM:
CT CHEST WITHOUT CONTRAST
TECHNIQUE: Multidetector CT imaging of the chest was performed following the
standard protocol without intravenous contrast. High resolution
imaging of the lungs, as well as inspiratory and expiratory imaging,
was performed.

[Series 4: high resolution · axial · 0.63mm/px · z∈[-304,-56]mm · 11 of 298 slices shown, 14 images]
[im 25/298  mediastinal]
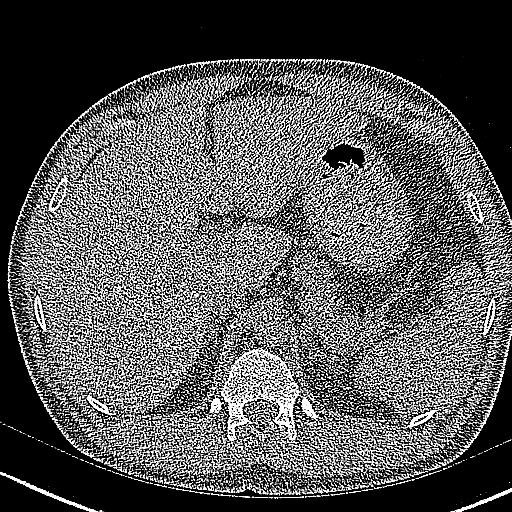
[im 25/298  lung]
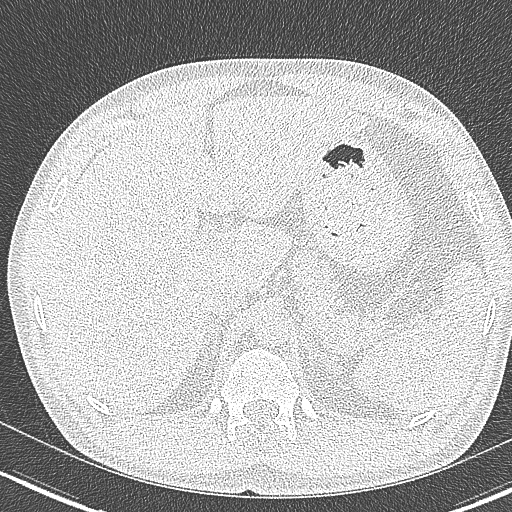
[im 50/298  lung]
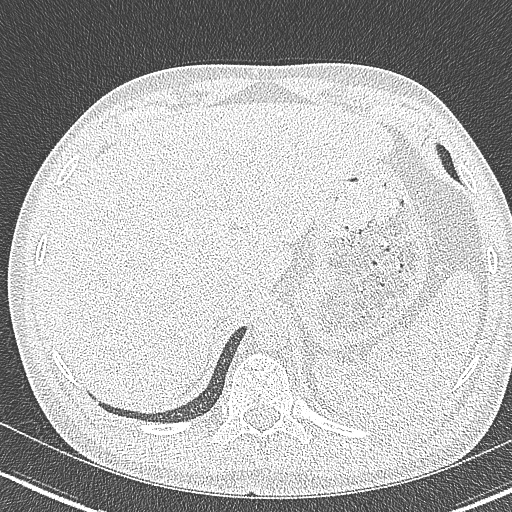
[im 75/298  lung]
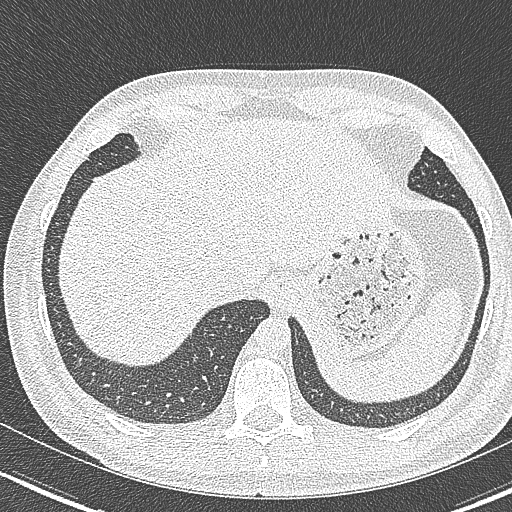
[im 100/298  lung]
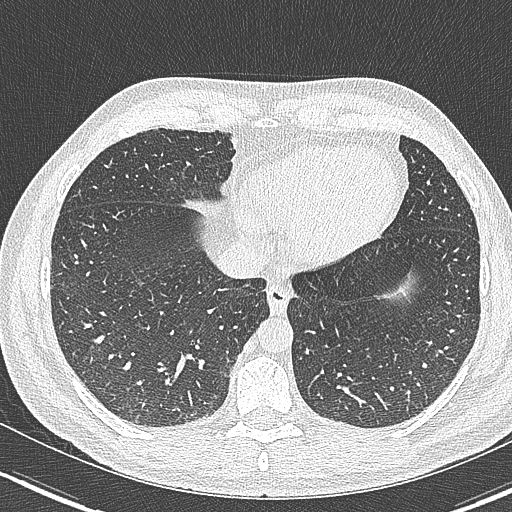
[im 124/298  mediastinal]
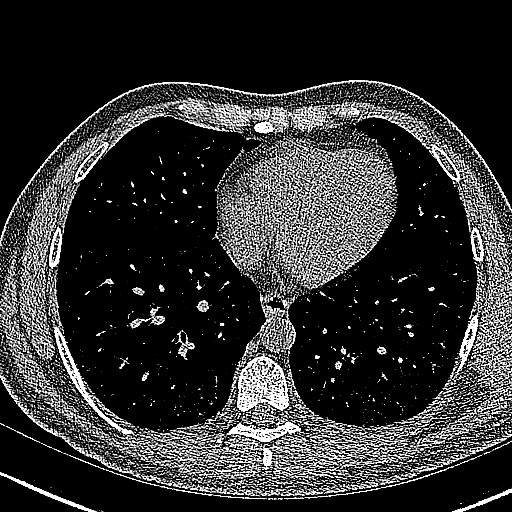
[im 124/298  lung]
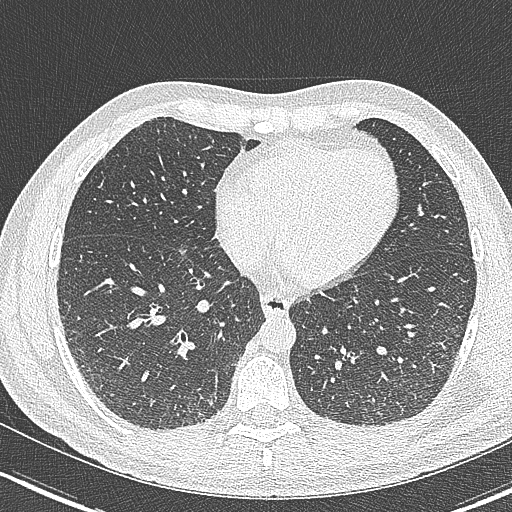
[im 149/298  lung]
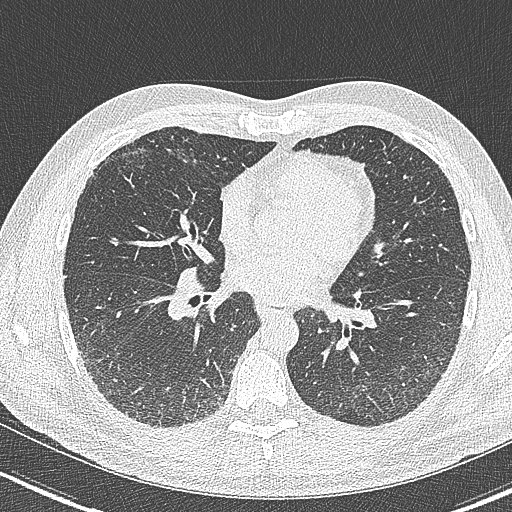
[im 174/298  lung]
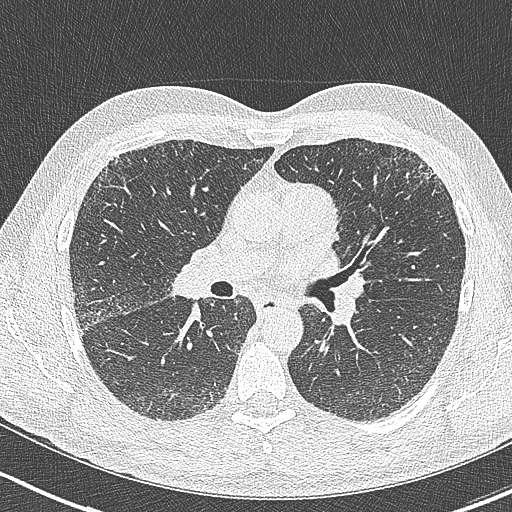
[im 199/298  lung]
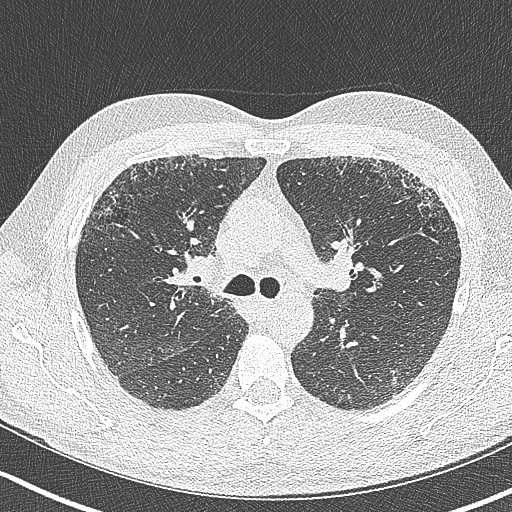
[im 223/298  mediastinal]
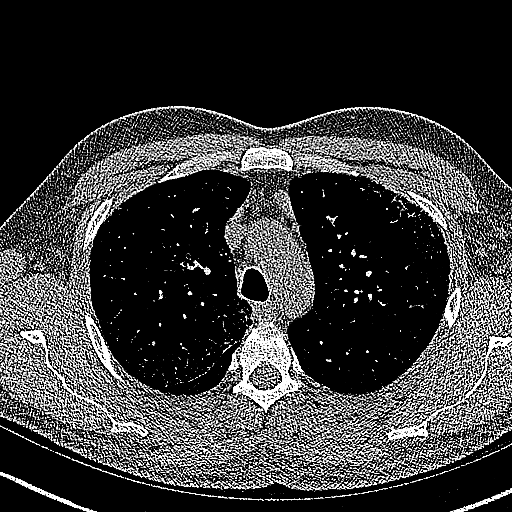
[im 223/298  lung]
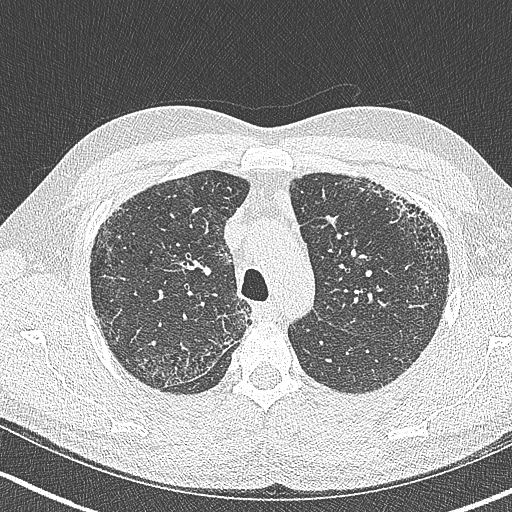
[im 248/298  lung]
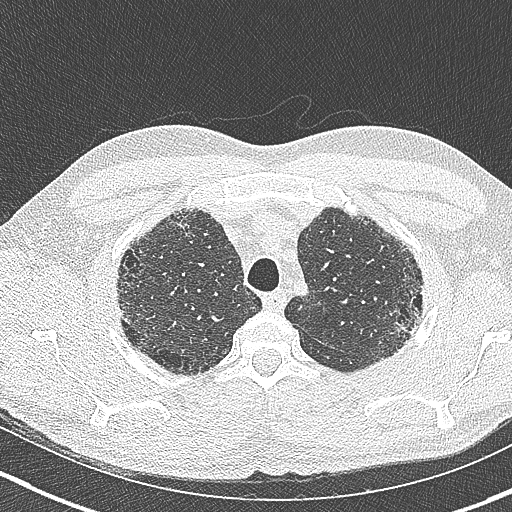
[im 273/298  lung]
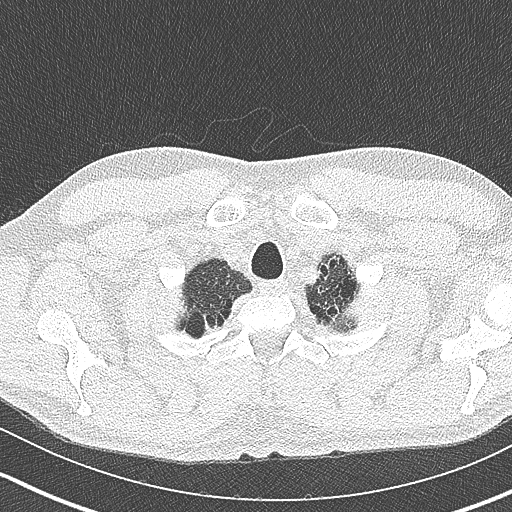

[Series 6: coronal · coronal · 0.61mm/px · 3 of 131 slices shown]
[im 27/131  lung]
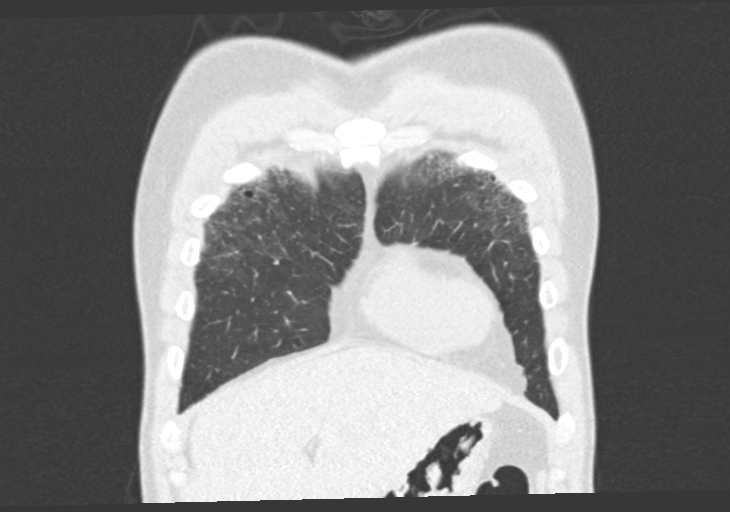
[im 53/131  lung]
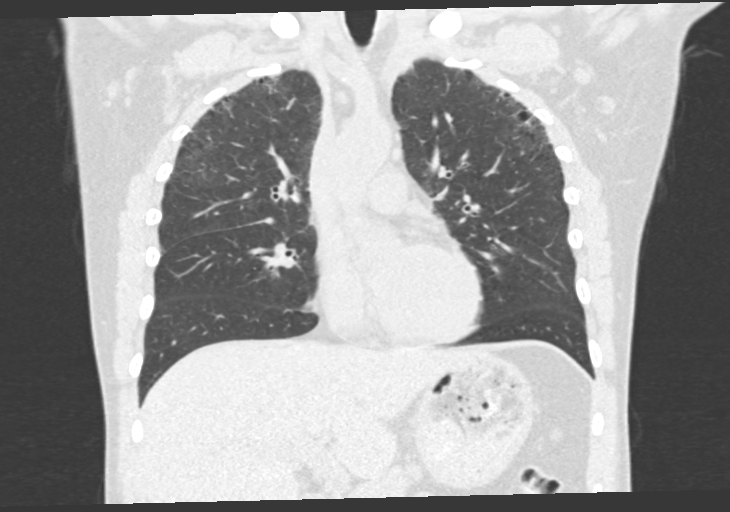
[im 79/131  lung]
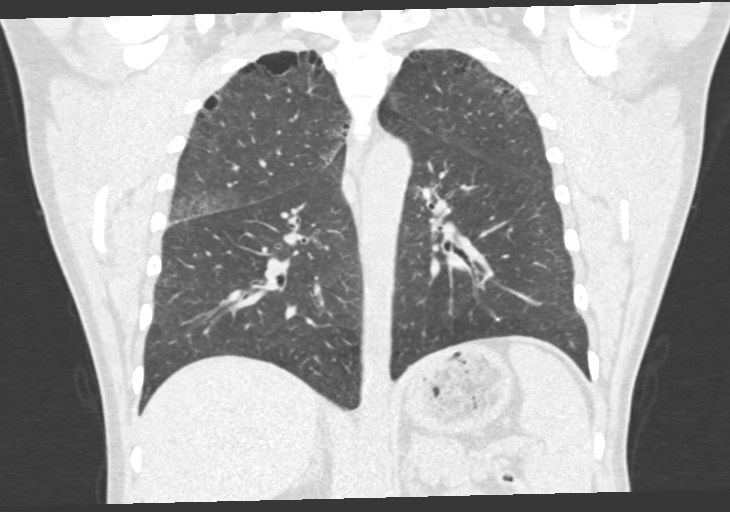

[14 of 36 positions shown; findings below may reference images not displayed]

FINDINGS: Cardiovascular: No significant vascular findings. Normal heart size.
No pericardial effusion.

Mediastinum/Nodes: Unchanged prominent mediastinal and hilar lymph
nodes. Thyroid gland, trachea, and esophagus demonstrate no
significant findings.

Lungs/Pleura: Mild-to-moderate centrilobular and paraseptal
emphysema. Diffuse bilateral bronchial wall thickening
redemonstrated pattern of irregular peripheral interstitial opacity
and ground-glass opacity without a clear gradient, although with
relative sparing of the lung bases. These findings are slightly
worsened in comparison to prior CT dated 02/14/2018, particularly
notable in the posterior right upper lobe and dependent right lower
lobe. No significant air trapping on expiratory phase imaging. No
pleural effusion or pneumothorax.

Upper Abdomen: No acute abnormality.

Musculoskeletal: No chest wall mass or suspicious bone lesions
identified.
IMPRESSION: 1. Redemonstrated pattern of irregular peripheral interstitial
opacity and ground-glass opacity without a clear gradient. These
findings are slightly worsened in comparison to prior CT dated
02/14/2018. Findings are suspicious for mild fibrotic interstitial
lung disease and remain in an "alternative diagnosis" pattern by ATS
pulmonary fibrosis criteria, primary differential consideration is
again NSIP.
2. Underlying emphysema.  Emphysema ([id number]).
3. Diffuse bilateral bronchial wall thickening, in keeping with
nonspecific infectious or inflammatory bronchitis, likely
smoking-related.
4. Unchanged prominent mediastinal and hilar lymph nodes, likely
reactive.
